# Patient Record
Sex: Female | Born: 1990 | Race: Black or African American | Hispanic: No | Marital: Married | Smoking: Never smoker
Health system: Southern US, Community
[De-identification: ages and names within clinical notes are randomized; demographics above are authoritative.]

## PROBLEM LIST (undated history)

## (undated) DIAGNOSIS — L97909 Non-pressure chronic ulcer of unspecified part of unspecified lower leg with unspecified severity: Secondary | ICD-10-CM

## (undated) DIAGNOSIS — E119 Type 2 diabetes mellitus without complications: Secondary | ICD-10-CM

## (undated) DIAGNOSIS — Z9889 Other specified postprocedural states: Secondary | ICD-10-CM

## (undated) DIAGNOSIS — I890 Lymphedema, not elsewhere classified: Secondary | ICD-10-CM

## (undated) DIAGNOSIS — I83009 Varicose veins of unspecified lower extremity with ulcer of unspecified site: Secondary | ICD-10-CM

## (undated) DIAGNOSIS — G8929 Other chronic pain: Secondary | ICD-10-CM

---
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
## 2020-09-27 ENCOUNTER — Inpatient Hospital Stay (HOSPITAL_COMMUNITY)
Admission: EM | Admit: 2020-09-27 | Discharge: 2020-10-01 | DRG: 603 | Disposition: A | Discharge status: Home / Self Care | Payer: Medicaid Other | Attending: Internal Medicine | Admitting: Internal Medicine

## 2020-09-27 ENCOUNTER — Other Ambulatory Visit: Payer: Self-pay

## 2020-09-27 ENCOUNTER — Encounter (HOSPITAL_COMMUNITY): Payer: Self-pay

## 2020-09-27 DIAGNOSIS — Z79891 Long term (current) use of opiate analgesic: Secondary | ICD-10-CM

## 2020-09-27 DIAGNOSIS — G894 Chronic pain syndrome: Secondary | ICD-10-CM

## 2020-09-27 DIAGNOSIS — L03116 Cellulitis of left lower limb: Principal | ICD-10-CM | POA: Diagnosis present

## 2020-09-27 DIAGNOSIS — D509 Iron deficiency anemia, unspecified: Secondary | ICD-10-CM | POA: Diagnosis present

## 2020-09-27 DIAGNOSIS — L03119 Cellulitis of unspecified part of limb: Secondary | ICD-10-CM | POA: Diagnosis not present

## 2020-09-27 DIAGNOSIS — D649 Anemia, unspecified: Secondary | ICD-10-CM

## 2020-09-27 DIAGNOSIS — I878 Other specified disorders of veins: Secondary | ICD-10-CM | POA: Diagnosis present

## 2020-09-27 DIAGNOSIS — E119 Type 2 diabetes mellitus without complications: Secondary | ICD-10-CM | POA: Diagnosis present

## 2020-09-27 DIAGNOSIS — Z20822 Contact with and (suspected) exposure to covid-19: Secondary | ICD-10-CM | POA: Diagnosis present

## 2020-09-27 DIAGNOSIS — R110 Nausea: Secondary | ICD-10-CM | POA: Diagnosis present

## 2020-09-27 DIAGNOSIS — L03115 Cellulitis of right lower limb: Secondary | ICD-10-CM | POA: Diagnosis present

## 2020-09-27 DIAGNOSIS — R600 Localized edema: Secondary | ICD-10-CM

## 2020-09-27 DIAGNOSIS — L0390 Cellulitis, unspecified: Secondary | ICD-10-CM | POA: Diagnosis present

## 2020-09-27 DIAGNOSIS — Z975 Presence of (intrauterine) contraceptive device: Secondary | ICD-10-CM

## 2020-09-27 DIAGNOSIS — E66813 Obesity, class 3: Secondary | ICD-10-CM

## 2020-09-27 DIAGNOSIS — Z6841 Body Mass Index (BMI) 40.0 and over, adult: Secondary | ICD-10-CM

## 2020-09-27 DIAGNOSIS — I890 Lymphedema, not elsewhere classified: Secondary | ICD-10-CM

## 2020-09-27 DIAGNOSIS — Z79899 Other long term (current) drug therapy: Secondary | ICD-10-CM

## 2020-09-27 HISTORY — DX: Type 2 diabetes mellitus without complications: E11.9

## 2020-09-27 HISTORY — DX: Other specified postprocedural states: Z98.890

## 2020-09-27 LAB — COMPREHENSIVE METABOLIC PANEL
ALT: 13 U/L (ref 0–44)
AST: 16 U/L (ref 15–41)
Albumin: 2.7 g/dL — ABNORMAL LOW (ref 3.5–5.0)
Alkaline Phosphatase: 97 U/L (ref 38–126)
Anion gap: 7 (ref 5–15)
BUN: 13 mg/dL (ref 6–20)
CO2: 24 mmol/L (ref 22–32)
Calcium: 8.8 mg/dL — ABNORMAL LOW (ref 8.9–10.3)
Chloride: 104 mmol/L (ref 98–111)
Creatinine, Ser: 0.54 mg/dL (ref 0.44–1.00)
GFR, Estimated: >60 mL/min (ref >60)
Glucose, Bld: 97 mg/dL (ref 70–99)
Potassium: 4.2 mmol/L (ref 3.5–5.1)
Sodium: 135 mmol/L (ref 135–145)
Total Bilirubin: 0.3 mg/dL (ref 0.3–1.2)
Total Protein: 8.2 g/dL — ABNORMAL HIGH (ref 6.5–8.1)

## 2020-09-27 LAB — CBC WITH DIFFERENTIAL/PLATELET
Abs Immature Granulocytes: 0.04 10*3/uL (ref 0.00–0.07)
Basophils Absolute: 0 10*3/uL (ref 0.0–0.1)
Basophils Relative: 0 %
Eosinophils Absolute: 0.2 10*3/uL (ref 0.0–0.5)
Eosinophils Relative: 2 %
HCT: 25.6 % — ABNORMAL LOW (ref 36.0–46.0)
Hemoglobin: 7.6 g/dL — ABNORMAL LOW (ref 12.0–15.0)
Immature Granulocytes: 0 %
Lymphocytes Relative: 14 %
Lymphs Abs: 1.5 10*3/uL (ref 0.7–4.0)
MCH: 21.5 pg — ABNORMAL LOW (ref 26.0–34.0)
MCHC: 29.7 g/dL — ABNORMAL LOW (ref 30.0–36.0)
MCV: 72.3 fL — ABNORMAL LOW (ref 80.0–100.0)
Monocytes Absolute: 0.9 10*3/uL (ref 0.1–1.0)
Monocytes Relative: 9 %
Neutro Abs: 8.3 10*3/uL — ABNORMAL HIGH (ref 1.7–7.7)
Neutrophils Relative %: 75 %
Platelets: 513 10*3/uL — ABNORMAL HIGH (ref 150–400)
RBC: 3.54 MIL/uL — ABNORMAL LOW (ref 3.87–5.11)
RDW: 18.6 % — ABNORMAL HIGH (ref 11.5–15.5)
WBC: 11 10*3/uL — ABNORMAL HIGH (ref 4.0–10.5)
nRBC: 0 % (ref 0.0–0.2)

## 2020-09-27 LAB — FERRITIN: Ferritin: 23 ng/mL (ref 11–307)

## 2020-09-27 LAB — IRON AND TIBC
Iron: 12 ug/dL — ABNORMAL LOW (ref 28–170)
Saturation Ratios: 4 % — ABNORMAL LOW (ref 10.4–31.8)
TIBC: 324 ug/dL (ref 250–450)
UIBC: 312 ug/dL

## 2020-09-27 LAB — RESP PANEL BY RT-PCR (FLU A&B, COVID) ARPGX2
Influenza A by PCR: NEGATIVE
Influenza B by PCR: NEGATIVE
SARS Coronavirus 2 by RT PCR: NEGATIVE

## 2020-09-27 LAB — LACTIC ACID, PLASMA: Lactic Acid, Venous: 0.9 mmol/L (ref 0.5–1.9)

## 2020-09-27 LAB — I-STAT BETA HCG BLOOD, ED (MC, WL, AP ONLY): I-stat hCG, quantitative: <5 m[IU]/mL (ref <5)

## 2020-09-27 LAB — CBG MONITORING, ED: Glucose-Capillary: 85 mg/dL (ref 70–99)

## 2020-09-27 MED ORDER — DULOXETINE HCL 60 MG PO CPEP
90.0000 mg | ORAL_CAPSULE | Freq: Every day | ORAL | Status: DC
  Administered 2020-09-28 – 2020-10-01 (×4): 90 mg via ORAL
  Filled 2020-09-27 (×5): qty 1

## 2020-09-27 MED ORDER — VANCOMYCIN HCL IN DEXTROSE 1-5 GM/200ML-% IV SOLN
1000.0000 mg | Freq: Once | INTRAVENOUS | Status: AC
  Administered 2020-09-27: 1000 mg via INTRAVENOUS
  Filled 2020-09-27: qty 200

## 2020-09-27 MED ORDER — SODIUM CHLORIDE 0.9 % IV SOLN
2.0000 g | Freq: Once | INTRAVENOUS | Status: AC
  Administered 2020-09-27: 2 g via INTRAVENOUS
  Filled 2020-09-27: qty 2

## 2020-09-27 MED ORDER — GABAPENTIN 300 MG PO CAPS
600.0000 mg | ORAL_CAPSULE | Freq: Three times a day (TID) | ORAL | Status: DC
  Administered 2020-09-27 – 2020-10-01 (×11): 600 mg via ORAL
  Filled 2020-09-27 (×12): qty 2

## 2020-09-27 MED ORDER — VANCOMYCIN HCL 1.5 G IV SOLR
1500.0000 mg | Freq: Once | INTRAVENOUS | Status: DC
  Filled 2020-09-27: qty 1500

## 2020-09-27 MED ORDER — ALBUTEROL SULFATE (2.5 MG/3ML) 0.083% IN NEBU: 2.5000 mg | INHALATION_SOLUTION | Freq: Four times a day (QID) | RESPIRATORY_TRACT | Status: DC | PRN

## 2020-09-27 MED ORDER — VANCOMYCIN HCL 1500 MG/300ML IV SOLN
1500.0000 mg | Freq: Once | INTRAVENOUS | Status: AC
  Administered 2020-09-27: 1500 mg via INTRAVENOUS
  Filled 2020-09-27: qty 300

## 2020-09-27 MED ORDER — VANCOMYCIN HCL 1500 MG/300ML IV SOLN
1500.0000 mg | Freq: Two times a day (BID) | INTRAVENOUS | Status: DC
  Administered 2020-09-28 – 2020-09-30 (×6): 1500 mg via INTRAVENOUS
  Filled 2020-09-27 (×8): qty 300

## 2020-09-27 MED ORDER — FENTANYL CITRATE (PF) 100 MCG/2ML IJ SOLN
50.0000 ug | Freq: Once | INTRAMUSCULAR | Status: AC
  Administered 2020-09-27: 50 ug via INTRAMUSCULAR
  Filled 2020-09-27: qty 2

## 2020-09-27 MED ORDER — METHOCARBAMOL 1000 MG/10ML IJ SOLN
500.0000 mg | Freq: Four times a day (QID) | INTRAVENOUS | Status: DC | PRN
  Filled 2020-09-27: qty 5

## 2020-09-27 MED ORDER — TIZANIDINE HCL 4 MG PO TABS
4.0000 mg | ORAL_TABLET | Freq: Three times a day (TID) | ORAL | Status: DC | PRN
  Administered 2020-09-28 – 2020-09-30 (×5): 4 mg via ORAL
  Filled 2020-09-27 (×5): qty 1

## 2020-09-27 MED ORDER — SODIUM CHLORIDE 0.9 % IV SOLN: 1.0000 g | Freq: Three times a day (TID) | INTRAVENOUS | Status: DC

## 2020-09-27 MED ORDER — ENOXAPARIN SODIUM 40 MG/0.4ML ~~LOC~~ SOLN
40.0000 mg | SUBCUTANEOUS | Status: DC
  Administered 2020-09-27: 40 mg via SUBCUTANEOUS
  Filled 2020-09-27: qty 0.4

## 2020-09-27 MED ORDER — ONDANSETRON HCL 4 MG/2ML IJ SOLN: 4.0000 mg | Freq: Four times a day (QID) | INTRAMUSCULAR | Status: DC | PRN

## 2020-09-27 MED ORDER — HYDROMORPHONE HCL 1 MG/ML IJ SOLN
0.5000 mg | Freq: Once | INTRAMUSCULAR | Status: AC
  Administered 2020-09-27: 0.5 mg via INTRAVENOUS
  Filled 2020-09-27: qty 1

## 2020-09-27 MED ORDER — ACETAMINOPHEN 650 MG RE SUPP: 650.0000 mg | Freq: Four times a day (QID) | RECTAL | Status: DC | PRN

## 2020-09-27 MED ORDER — MORPHINE SULFATE (PF) 2 MG/ML IV SOLN
2.0000 mg | INTRAVENOUS | Status: DC | PRN
  Administered 2020-09-27 – 2020-09-29 (×6): 2 mg via INTRAVENOUS
  Filled 2020-09-27 (×6): qty 1

## 2020-09-27 MED ORDER — DIPHENHYDRAMINE HCL 25 MG PO CAPS
25.0000 mg | ORAL_CAPSULE | Freq: Once | ORAL | Status: AC
  Administered 2020-09-27: 25 mg via ORAL
  Filled 2020-09-27: qty 1

## 2020-09-27 MED ORDER — OXYCODONE HCL 5 MG PO TABS
10.0000 mg | ORAL_TABLET | ORAL | Status: DC | PRN
  Administered 2020-09-28 – 2020-10-01 (×13): 10 mg via ORAL
  Filled 2020-09-27 (×16): qty 2

## 2020-09-27 MED ORDER — SODIUM CHLORIDE 0.9 % IV SOLN
2.0000 g | Freq: Three times a day (TID) | INTRAVENOUS | Status: DC
  Administered 2020-09-28 – 2020-10-01 (×10): 2 g via INTRAVENOUS
  Filled 2020-09-27 (×13): qty 2

## 2020-09-27 MED ORDER — HYDROMORPHONE HCL 1 MG/ML IJ SOLN
0.5000 mg | Freq: Once | INTRAMUSCULAR | Status: AC
Start: 2020-09-27 — End: 2020-09-27
  Administered 2020-09-27: 0.5 mg via INTRAVENOUS
  Filled 2020-09-27: qty 1

## 2020-09-27 MED ORDER — ONDANSETRON HCL 4 MG PO TABS: 4.0000 mg | ORAL_TABLET | Freq: Four times a day (QID) | ORAL | Status: DC | PRN

## 2020-09-27 MED ORDER — ACETAMINOPHEN 325 MG PO TABS
650.0000 mg | ORAL_TABLET | Freq: Four times a day (QID) | ORAL | Status: DC | PRN
  Administered 2020-09-30: 650 mg via ORAL
  Filled 2020-09-27: qty 2

## 2020-09-27 NOTE — H&P (Signed)
History and Physical  Patient Name: [name] [name]     FXT:[id number]    DOB: 11/10/1990    DOA: 09/27/2020 PCP: Pcp, No  Patient coming from: Home  Chief Complaint: Bilateral lower extremity wounds, pain    HPI: [name] [name] is a 30 y.o. female, with PMH of chronic lymphedema, chronic bilateral lower extremity wounds, chronic pain who presented to the ER on 09/27/2020 with worsening bilateral lower extremity pain.  Patient presents due to bilateral lower extremity pain that is worsening around her chronic wounds.  She said that she been more erythema, edema, with some small amount of drainage.  She says she also had a fever at home.  She has not been on antibiotics in over a month.  She says she follows outpatient with wound care once a month.  She has had multiple ER visits for similar presentations.  She was last evaluated at [facility] ER on 09/27/2020.  She has been taking her home oral pain medications with no improvement.  She also complains of ongoing chronic nausea.  She has had associated difficulty with ambulation due to pain.    ED course: -Vitals on admission: Afebrile, heart rate 112, blood pressure 132/67, maintaining sats on room air -Labs on initial presentation: Sodium 135, potassium 4.2, chloride 104, bicarb 24, glucose 97, BUN 13, creatinine 0.5, calcium 8.8, WBC 11, hemoglobin 7.6, platelets 513 -Imaging obtained on admission: none -In the ED the patient was given vancomycin, cefepime, Benadryl, Dilaudid, fentanyl, and the hospitalist service was contacted for further evaluation and management.     ROS: A complete and thorough 12 point review of systems obtained, negative listed in HPI.     Past Medical History:  Diagnosis Date  . DM (diabetes mellitus) (HCC)   . S/P debridement     History reviewed. No pertinent surgical history.  Social History: Patient lives at home.  The patient walks without assistance.  Non smoker.  Allergies  Allergen Reactions  .  Silver     Family history: family history is not on file.  Prior to Admission medications   Medication Sig Start Date End Date Taking? Authorizing Provider  DULoxetine (CYMBALTA) 30 MG capsule Take 90 mg by mouth daily. 08/11/20  Yes [provider]  gabapentin (NEURONTIN) 600 MG tablet Take 600 mg by mouth 3 (three) times daily. 09/15/20  Yes [provider]  HYDROcodone-acetaminophen (NORCO) 10-325 MG tablet Take 1 tablet by mouth in the morning, at noon, in the evening, and at bedtime. 09/17/20  Yes [provider]  tiZANidine (ZANAFLEX) 4 MG tablet Take 4 mg by mouth 3 (three) times daily as needed for muscle spasms. 09/05/20  Yes [provider]       Physical Exam: BP (!) 142/79   Pulse (!) 116   Temp 97.7 F (36.5 C) (Rectal)   Resp 17   Ht 5\' 7"  (1.702 m)   Wt 131.1 kg   SpO2 100%   BMI 45.26 kg/m   General appearance: Well-developed, adult female, alert and in moderate distress secondary to pain Eyes: Anicteric, conjunctiva pink, lids and lashes normal. PERRL.    ENT: No nasal deformity, discharge, epistaxis.  Hearing intact. OP moist without lesions.   Neck: No neck masses.  Trachea midline.  No thyromegaly/tenderness. Lymph: No cervical or supraclavicular lymphadenopathy. Cardiac: RRR, nl S1-S2, no murmurs appreciated.    Bilateral lower extremity lymphedema Respiratory: Normal respiratory rate and rhythm.  CTAB without rales or wheezes. Abdomen: Abdomen soft.  No  tenderness with palpation. No ascites, distension, hepatosplenomegaly.   MSK:  Bilateral lower extremity ulcer/wound primarily on the posterior lower legs.  Some erythema.  Tenderness and warmth noted Neuro: Cranial nerves 2 through 12 grossly intact.  Sensation intact to light touch. Speech is fluent.  Marland Kitchen    Psych: Sensorium intact and responding to questions, attention normal.  Behavior appropriate.  Judgment and insight appear normal.    Labs on Admission:  I have  personally reviewed following labs and imaging studies: CBC: Recent Labs  Lab 09/27/20 1940  WBC 11.0*  NEUTROABS 8.3*  HGB 7.6*  HCT 25.6*  MCV 72.3*  PLT 513*   Basic Metabolic Panel: Recent Labs  Lab 09/27/20 1940  NA 135  K 4.2  CL 104  CO2 24  GLUCOSE 97  BUN 13  CREATININE 0.54  CALCIUM 8.8*   GFR: Estimated Creatinine Clearance: 142.5 mL/min (by C-G formula based on SCr of 0.54 mg/dL).  Liver Function Tests: Recent Labs  Lab 09/27/20 1940  AST 16  ALT 13  ALKPHOS 97  BILITOT 0.3  PROT 8.2*  ALBUMIN 2.7*   No results for input(s): LIPASE, AMYLASE in the last 168 hours. No results for input(s): AMMONIA in the last 168 hours. Coagulation Profile: No results for input(s): INR, PROTIME in the last 168 hours. Cardiac Enzymes: No results for input(s): CKTOTAL, CKMB, CKMBINDEX, TROPONINI in the last 168 hours. BNP (last 3 results) No results for input(s): PROBNP in the last 8760 hours. HbA1C: No results for input(s): HGBA1C in the last 72 hours. CBG: Recent Labs  Lab 09/27/20 1826  GLUCAP 85   Lipid Profile: No results for input(s): CHOL, HDL, LDLCALC, TRIG, CHOLHDL, LDLDIRECT in the last 72 hours. Thyroid Function Tests: No results for input(s): TSH, T4TOTAL, FREET4, T3FREE, THYROIDAB in the last 72 hours. Anemia Panel: No results for input(s): VITAMINB12, FOLATE, FERRITIN, TIBC, IRON, RETICCTPCT in the last 72 hours.   No results found for this or any previous visit (from the past 240 hour(s)).         Radiological Exams on Admission: Personally reviewed imaging which shows: No results found.        Assessment/Plan   1.  Acute on chronic bilateral lower extremity wounds/ulcer -Patient presents with worsening pain and erythema around her chronic lower extremity wounds - History of lymphedema - Continue IV antibiotics with cefepime and vancomycin - Pain control as warranted - Wound care consulted  2.  Chronic pain syndrome -  Patient appears to have chronic pain related to her wounds.  At home on hydrocodone, gabapentin, muscle relaxer - Continue home gabapentin and muscle relaxer - Continue pain control as warranted  3.  Anemia -On admission hemoglobin 7.6 - Appears to have chronic anemia based on outside labs - Iron studies ordered - No transfusion warranted at this time - Follow-up labs ordered  4.  Chronic bilateral lower extremity edema, lymphedema - Suspect source of bilateral lower extremity wounds - See further plans above  5.  Morbid obesity -BMI 45 - Recommend diet, exercise and weight loss     DVT prophylaxis: Lovenox Code Status: Full Family Communication: Patient herself Disposition Plan: Anticipate discharge home when medically optimized Consults called: None Admission status: Observation   At the point of initial evaluation, it is my clinical opinion that admission for OBSERVATION is reasonable and necessary because the patient's presenting complaints in the context of their chronic conditions represent sufficient risk of deterioration or significant morbidity to constitute reasonable grounds  for close observation in the hospital setting, but that the patient may be medically stable for discharge from the hospital within 24 to 48 hours.    Medical decision making: Patient seen at 10:34 PM on 09/27/2020.  The patient was discussed with ER provider.  What exists of the patient's chart was reviewed in depth and summarized above.  Clinical condition: Fair.        Laqueta Due [facility] Please page though AMION or Epic secure chat:  For password, contact charge nurse

## 2020-09-27 NOTE — ED Notes (Signed)
ED TO INPATIENT HANDOFF REPORT  Name/Age/Gender [name] [name] 30 y.o. female  Code Status    Code Status Orders  (From admission, onward)         Start     Ordered   09/27/20 2149  Full code  Continuous        09/27/20 2150        Code Status History    This patient has a current code status but no historical code status.   Advance Care Planning Activity      Home/SNF/Other Home  Chief Complaint Cellulitis [L03.90]  Level of Care/Admitting Diagnosis ED Disposition    ED Disposition Condition Comment   Admit  [facility]ospital [facility]: [facility] [[id number]]  Level of Care: Med-Surg [16]  Covid Evaluation: Asymptomatic Screening Protocol (No Symptoms)  Diagnosis: Cellulitis [474259]  Admitting Physician: Laqueta Due [[id number]]  Attending Physician: Laqueta Due [[id number]]       Medical History Past Medical History:  Diagnosis Date  . DM (diabetes mellitus) (HCC)   . S/P debridement     Allergies Allergies  Allergen Reactions  . Silver     IV Location/Drains/Wounds Patient Lines/Drains/Airways Status    Active Line/Drains/Airways    Name Placement date Placement time Site Days   Peripheral IV 09/27/20 Left Forearm 09/27/20  1929  Forearm  less than 1   Peripheral IV 09/27/20 Left Forearm 09/27/20  1940  Forearm  less than 1          Labs/Imaging Results for orders placed or performed during the hospital encounter of 09/27/20 (from the past 48 hour(s))  CBG monitoring, ED     Status: None   Collection Time: 09/27/20  6:26 PM  Result Value Ref Range   Glucose-Capillary 85 70 - 99 mg/dL    Comment: Glucose reference range applies only to samples taken after fasting for at least 8 hours.  Comprehensive metabolic panel     Status: Abnormal   Collection Time: 09/27/20  7:40 PM  Result Value Ref Range   Sodium 135 135 - 145 mmol/L   Potassium 4.2 3.5 - 5.1 mmol/L   Chloride 104 98 - 111 mmol/L   CO2 24 22 - 32 mmol/L    Glucose, Bld 97 70 - 99 mg/dL    Comment: Glucose reference range applies only to samples taken after fasting for at least 8 hours.   BUN 13 6 - 20 mg/dL   Creatinine, Ser 8.84 0.44 - 1.00 mg/dL   Calcium 8.8 (L) 8.9 - 10.3 mg/dL   Total Protein 8.2 (H) 6.5 - 8.1 g/dL   Albumin 2.7 (L) 3.5 - 5.0 g/dL   AST 16 15 - 41 U/L   ALT 13 0 - 44 U/L   Alkaline Phosphatase 97 38 - 126 U/L   Total Bilirubin 0.3 0.3 - 1.2 mg/dL   GFR, Estimated >16 >60 mL/min    Comment: (NOTE) Calculated using the CKD-EPI Creatinine Equation (2021)    Anion gap 7 5 - 15    Comment: Performed at [facility], [address], [place], [place] [address]  CBC with Differential     Status: Abnormal   Collection Time: 09/27/20  7:40 PM  Result Value Ref Range   WBC 11.0 (H) 4.0 - 10.5 K/uL   RBC 3.54 (L) 3.87 - 5.11 MIL/uL   Hemoglobin 7.6 (L) 12.0 - 15.0 g/dL    Comment: Reticulocyte Hemoglobin testing may be clinically indicated, consider ordering this additional  test [id number]    HCT 25.6 (L) 36.0 - 46.0 %   MCV 72.3 (L) 80.0 - 100.0 fL   MCH 21.5 (L) 26.0 - 34.0 pg   MCHC 29.7 (L) 30.0 - 36.0 g/dL   RDW 54.018.6 (H) 98.111.5 - 19.115.5 %   Platelets 513 (H) 150 - 400 K/uL   nRBC 0.0 0.0 - 0.2 %   Neutrophils Relative % 75 %   Neutro Abs 8.3 (H) 1.7 - 7.7 K/uL   Lymphocytes Relative 14 %   Lymphs Abs 1.5 0.7 - 4.0 K/uL   Monocytes Relative 9 %   Monocytes Absolute 0.9 0.1 - 1.0 K/uL   Eosinophils Relative 2 %   Eosinophils Absolute 0.2 0.0 - 0.5 K/uL   Basophils Relative 0 %   Basophils Absolute 0.0 0.0 - 0.1 K/uL   Immature Granulocytes 0 %   Abs Immature Granulocytes 0.04 0.00 - 0.07 K/uL    Comment: Performed at [facility], [address] [place], [place] [address]  Lactic acid, plasma     Status: None   Collection Time: 09/27/20  7:40 PM  Result Value Ref Range   Lactic Acid, Venous 0.9 0.5 - 1.9 mmol/L    Comment: Performed at [facility], [address] [place], [place] [address]  I-Stat Beta hCG blood, ED ([facility], [facility], [facility] only)     Status: None   Collection Time: 09/27/20  8:38 PM  Result Value Ref Range   I-stat hCG, quantitative <5.0 <5 mIU/mL   Comment 3            Comment:   GEST. AGE      CONC.  (mIU/mL)   <=1 WEEK        5 - 50     2 WEEKS       50 - 500     3 WEEKS       100 - 10,000     4 WEEKS     1,000 - 30,000        FEMALE AND NON-PREGNANT FEMALE:     LESS THAN 5 mIU/mL   Resp Panel by RT-PCR (Flu A&B, Covid) Nasopharyngeal Swab     Status: None   Collection Time: 09/27/20  9:22 PM   Specimen: Nasopharyngeal Swab; Nasopharyngeal(NP) swabs in vial transport medium  Result Value Ref Range   SARS Coronavirus 2 by RT PCR NEGATIVE NEGATIVE    Comment: (NOTE) SARS-CoV-2 target nucleic acids are NOT DETECTED.  The SARS-CoV-2 RNA is generally detectable in upper respiratory specimens during the acute phase of infection. The lowest concentration of SARS-CoV-2 viral copies this assay can detect is 138 copies/mL. A negative result does not preclude SARS-Cov-2 infection and should not be used as the sole basis for treatment or other patient management decisions. A negative result may occur with  improper specimen collection/handling, submission of specimen other than nasopharyngeal swab, presence of viral mutation(s) within the areas targeted by this assay, and inadequate number of viral copies(<138 copies/mL). A negative result must be combined with clinical observations, patient history, and epidemiological information. The expected result is Negative.  Fact Sheet for Patients:  BloggerCourse.comhttps://www.fda.gov/media/152166/download  Fact Sheet for Healthcare Providers:  SeriousBroker.ithttps://www.fda.gov/media/152162/download  This test is no t yet approved or cleared by the [place] FDA and  has been authorized for detection and/or diagnosis of SARS-CoV-2 by FDA under an Emergency Use Authorization (EUA). This EUA will remain  in  effect (meaning this test can  be used) for the duration of the COVID-19 declaration under Section 564(b)(1) of the Act, 21 U.S.C.section 360bbb-3(b)(1), unless the authorization is terminated  or revoked sooner.       Influenza A by PCR NEGATIVE NEGATIVE   Influenza B by PCR NEGATIVE NEGATIVE    Comment: (NOTE) The Xpert Xpress SARS-CoV-2/FLU/RSV plus assay is intended as an aid in the diagnosis of influenza from Nasopharyngeal swab specimens and should not be used as a sole basis for treatment. Nasal washings and aspirates are unacceptable for Xpert Xpress SARS-CoV-2/FLU/RSV testing.  Fact Sheet for Patients: BloggerCourse.com  Fact Sheet for Healthcare Providers: SeriousBroker.it  This test is not yet approved or cleared by the [place] FDA and has been authorized for detection and/or diagnosis of SARS-CoV-2 by FDA under an Emergency Use Authorization (EUA). This EUA will remain in effect (meaning this test can be used) for the duration of the COVID-19 declaration under Section 564(b)(1) of the Act, 21 U.S.C. section 360bbb-3(b)(1), unless the authorization is terminated or revoked.  Performed at [facility], [address] [place], [place] [address]    No results found.  Pending Labs Unresulted Labs (From admission, onward)          Start     Ordered   10/04/20 0500  Creatinine, serum  (enoxaparin (LOVENOX)    CrCl >/= 30 ml/min)  Weekly,   R     Comments: while on enoxaparin therapy    09/27/20 2150   09/28/20 0500  HIV Antibody (routine testing w rflx)  (HIV Antibody (Routine testing w reflex) panel)  Tomorrow morning,   R        09/27/20 2150   09/28/20 0500  Comprehensive metabolic panel  Tomorrow morning,   R        09/27/20 2150   09/28/20 0500  CBC  Tomorrow morning,   R        09/27/20 2150   09/28/20 0500  Magnesium  Tomorrow morning,   R        09/27/20 2150   09/28/20 0500   Phosphorus  Tomorrow morning,   R        09/27/20 2150   09/27/20 2238  Ferritin  Add-on,   AD        09/27/20 2237   09/27/20 2238  Iron and TIBC  Add-on,   AD        09/27/20 2237   09/27/20 1829  Blood culture (routine x 2)  BLOOD CULTURE X 2,   STAT      09/27/20 1829          Vitals/Pain Today's Vitals   09/27/20 2200 09/27/20 2214 09/27/20 2222 09/27/20 2230  BP: (!) 142/79   (!) 124/59  Pulse: (!) 116   (!) 108  Resp: 17   20  Temp:      TempSrc:      SpO2: 100%   99%  Weight:  131.1 kg    Height:  5\' 7"  (1.702 m)    PainSc:   10-Worst pain ever     Isolation Precautions Airborne and Contact precautions  Medications Medications  enoxaparin (LOVENOX) injection 40 mg (has no administration in time range)  acetaminophen (TYLENOL) tablet 650 mg (has no administration in time range)    Or  acetaminophen (TYLENOL) suppository 650 mg (has no administration in time range)  oxyCODONE (Oxy IR/ROXICODONE) immediate release tablet 10 mg (has no administration in time range)  morphine 2 MG/ML injection  2 mg (has no administration in time range)  ondansetron (ZOFRAN) tablet 4 mg (has no administration in time range)    Or  ondansetron (ZOFRAN) injection 4 mg (has no administration in time range)  albuterol (PROVENTIL) (2.5 MG/3ML) 0.083% nebulizer solution 2.5 mg (has no administration in time range)  DULoxetine (CYMBALTA) DR capsule 90 mg (has no administration in time range)  gabapentin (NEURONTIN) capsule 600 mg (has no administration in time range)  tiZANidine (ZANAFLEX) tablet 4 mg (has no administration in time range)  ceFEPIme (MAXIPIME) 2 g in sodium chloride 0.9 % 100 mL IVPB (has no administration in time range)  vancomycin (VANCOREADY) IVPB 1500 mg/300 mL (has no administration in time range)  fentaNYL (SUBLIMAZE) injection 50 mcg (50 mcg Intramuscular Given 09/27/20 1859)  HYDROmorphone (DILAUDID) injection 0.5 mg (0.5 mg Intravenous Given 09/27/20 2030)   diphenhydrAMINE (BENADRYL) capsule 25 mg (25 mg Oral Given 09/27/20 2030)  vancomycin (VANCOCIN) IVPB 1000 mg/200 mL premix (0 mg Intravenous Stopped 09/27/20 2244)  ceFEPIme (MAXIPIME) 2 g in sodium chloride 0.9 % 100 mL IVPB (0 g Intravenous Stopped 09/27/20 2151)  HYDROmorphone (DILAUDID) injection 0.5 mg (0.5 mg Intravenous Given 09/27/20 2137)    Mobility Usually ambulatory with walker, due to pain patient has used a wheelchair while in ED

## 2020-09-27 NOTE — ED Provider Notes (Signed)
[facility]-EMERGENCY DEPT Provider Note   CSN: [id number] Arrival date & time: 09/27/20  1746     History Chief Complaint  Patient presents with  . Leg Swelling  . Wound Infection    [name] [name] is a 30 y.o. female.  HPI   30 year old female history of diabetes, who presents to the emergency department today for evaluation of bilateral lower extremity pain and wound infection.  She has chronic bilateral lower extremity wounds that are nonhealing that she states for the last 2 days have been more painful, red and swollen.  She states that she has had some drainage from the wounds and is also had a fever at home.  She has not been on any antibiotics recently per her report.  Past Medical History:  Diagnosis Date  . DM (diabetes mellitus) (HCC)   . S/P debridement     Patient Active Problem List   Diagnosis Date Noted  . Cellulitis 09/27/2020    History reviewed. No pertinent surgical history.   OB History   No obstetric history on file.     No family history on file.     Home Medications Prior to Admission medications   Medication Sig Start Date End Date Taking? Authorizing Provider  DULoxetine (CYMBALTA) 30 MG capsule Take 90 mg by mouth daily. 08/11/20  Yes [provider]  gabapentin (NEURONTIN) 600 MG tablet Take 600 mg by mouth 3 (three) times daily. 09/15/20  Yes [provider]  HYDROcodone-acetaminophen (NORCO) 10-325 MG tablet Take 1 tablet by mouth in the morning, at noon, in the evening, and at bedtime. 09/17/20  Yes [provider]  tiZANidine (ZANAFLEX) 4 MG tablet Take 4 mg by mouth 3 (three) times daily as needed for muscle spasms. 09/05/20  Yes [provider]    Allergies    Silver  Review of Systems   Review of Systems  Constitutional: Negative for fever.  HENT: Negative for ear pain and sore throat.   Eyes: Negative for visual disturbance.  Respiratory: Negative for cough and shortness  of breath.   Cardiovascular: Positive for leg swelling. Negative for chest pain.  Gastrointestinal: Negative for abdominal pain, constipation, diarrhea, nausea and vomiting.  Genitourinary: Negative for dysuria and hematuria.  Musculoskeletal:       Ble pain  Skin: Positive for wound.  Neurological: Negative for headaches.  All other systems reviewed and are negative.   Physical Exam Updated Vital Signs BP (!) 147/82   Pulse (!) 117   Temp 97.7 F (36.5 C) (Rectal)   Resp (!) 22   SpO2 100%   Physical Exam Vitals and nursing note reviewed.  Constitutional:      General: She is in acute distress.     Appearance: She is well-developed.  HENT:     Head: Normocephalic and atraumatic.  Eyes:     Conjunctiva/sclera: Conjunctivae normal.  Cardiovascular:     Rate and Rhythm: Regular rhythm. Tachycardia present.     Heart sounds: No murmur heard.   Pulmonary:     Effort: Pulmonary effort is normal. No respiratory distress.     Breath sounds: Normal breath sounds.  Abdominal:     Palpations: Abdomen is soft.     Tenderness: There is no abdominal tenderness.  Musculoskeletal:     Cervical back: Neck supple.     Comments: Chronic wounds to the bilateral lower extremities as noted below.  There is surrounding induration and erythema to both of the wounds and significant  tenderness noted.  There is some purulent drainage noted to the wounds as well.  There is no crepitus.  Skin:    General: Skin is warm and dry.  Neurological:     Mental Status: She is alert.       ED Results / Procedures / Treatments   Labs (all labs ordered are listed, but only abnormal results are displayed) Labs Reviewed  COMPREHENSIVE METABOLIC PANEL - Abnormal; Notable for the following components:      Result Value   Calcium 8.8 (*)    Total Protein 8.2 (*)    Albumin 2.7 (*)    All other components within normal limits  CBC WITH DIFFERENTIAL/PLATELET - Abnormal; Notable for the following  components:   WBC 11.0 (*)    RBC 3.54 (*)    Hemoglobin 7.6 (*)    HCT 25.6 (*)    MCV 72.3 (*)    MCH 21.5 (*)    MCHC 29.7 (*)    RDW 18.6 (*)    Platelets 513 (*)    Neutro Abs 8.3 (*)    All other components within normal limits  CULTURE, BLOOD (ROUTINE X 2)  CULTURE, BLOOD (ROUTINE X 2)  RESP PANEL BY RT-PCR (FLU A&B, COVID) [id number]  LACTIC ACID, PLASMA  CBG MONITORING, ED  I-STAT BETA HCG BLOOD, ED ([facility], [facility], [facility] ONLY)    EKG None  Radiology No results found.  Procedures Procedures   Medications Ordered in ED Medications  vancomycin (VANCOCIN) IVPB 1000 mg/200 mL premix (1,000 mg Intravenous New Bag/Given 09/27/20 2121)  ceFEPIme (MAXIPIME) 2 g in sodium chloride 0.9 % 100 mL IVPB (0 g Intravenous Stopped 09/27/20 2151)  fentaNYL (SUBLIMAZE) injection 50 mcg (50 mcg Intramuscular Given 09/27/20 1859)  HYDROmorphone (DILAUDID) injection 0.5 mg (0.5 mg Intravenous Given 09/27/20 2030)  diphenhydrAMINE (BENADRYL) capsule 25 mg (25 mg Oral Given 09/27/20 2030)  HYDROmorphone (DILAUDID) injection 0.5 mg (0.5 mg Intravenous Given 09/27/20 2137)    ED Course  I have reviewed the triage vital signs and the nursing notes.  Pertinent labs & imaging results that were available during my care of the patient were reviewed by me and considered in my medical decision making (see chart for details).  Clinical Course as of 09/27/20 2143  Sun Sep 27, 2020  [id number] 30 year old diabetic here by ambulance for bilateral lower extremity pain.  She has chronic lymphedema and deep ulcerations on her legs.  They do not look overtly infected.  She was tachycardic on arrival with this is improving.  Getting some pain medication and checking some labs.  Likely can be discharged if no significant derangements. [MB]    Clinical Course User Index [MB] Terrilee Files, MD   MDM Rules/Calculators/A&P                          30 year old female with chronic bilateral lower extremity wounds coming in  today with worsening of her pain and increased redness/swelling.  She states she is had fevers at home  CBC shows a mild leukocytosis at 11, hemoglobin is 7 which on chart review appears chronic CMP is grossly reassuring Lactic acid is negative Blood cultures were obtained Pregnancy test negative Covid pending  ekg pending on admission   Patient with chronic bilateral lower extremity wounds which appear infected today.  Patient was started on broad-spectrum antibiotics will be admitted to the hospital for further treatment  9:25 PM CONSULT with Dr. Marikay Alar who  accepts patient for admission   Final Clinical Impression(s) / ED Diagnoses Final diagnoses:  Cellulitis, unspecified cellulitis site    Rx / DC Orders ED Discharge Orders    None       Rayne Du 09/27/20 2143    Terrilee Files, MD 09/28/20 1003

## 2020-09-27 NOTE — ED Notes (Signed)
Patient again informed not to scratch wounds

## 2020-09-27 NOTE — ED Notes (Signed)
Pt assisted to restroom via wheelchair

## 2020-09-27 NOTE — Progress Notes (Signed)
PHARMACY NOTE:  ANTIMICROBIAL RENAL DOSAGE ADJUSTMENT  Current antimicrobial regimen includes a mismatch between antimicrobial dosage and estimated renal function.  As per policy approved by the Pharmacy & Therapeutics and Medical Executive Committees, the antimicrobial dosage will be adjusted accordingly.  Current antimicrobial dosage:  Cefepime 1gm IV [phone]  Indication: Wound infection  Renal Function:  Estimated Creatinine Clearance: 142.5 mL/min (by C-G formula based on SCr of 0.54 mg/dL). Weight = 131.1 kg  Antimicrobial dosage has been changed to:  Cefepime 2gm IV q8h   Thank you for allowing pharmacy to be a part of this patient's care.  Maryellen Pile, [facility] 09/27/2020 10:30 PM

## 2020-09-27 NOTE — Progress Notes (Signed)
Pharmacy Antibiotic Note  [name] [name] is a 30 y.o. female admitted on 09/27/2020 with cellulitis.  PMH significant for DM, chronic lower extremity wounds.  Patient received Vancomycin 1gm and Cefepime 2gm IV x 1 dose each in the ED.  Upon admission, Pharmacy has been consulted for Vancomycin dosing.  Plan: Give an additional Vancomycin 1500mg  IV x 1 dose now for total loading dose of 2500mg  (20 mg/kg) then continue with Vancomycin 1500 mg IV Q 12 hrs. Goal AUC 400-550.  Expected AUC: 532.9  SCr used: 0.8 (rounded up from 0.54) Cefepime 2gm IV q8h  Follow renal function  F/U culture results and sensivities  Monitor vancomycin levels as needed  Height: 5\' 7"  (170.2 cm) Weight: 131.1 kg (289 lb) IBW/kg (Calculated) : 61.6  Temp (24hrs), Avg:98.1 F (36.7 C), Min:97.7 F (36.5 C), Max:98.5 F (36.9 C)  Recent Labs  Lab 09/27/20 1940  WBC 11.0*  CREATININE 0.54  LATICACIDVEN 0.9    Estimated Creatinine Clearance: 142.5 mL/min (by C-G formula based on SCr of 0.54 mg/dL).    Allergies  Allergen Reactions  . Silver     Antimicrobials this admission: 4/17 Cefepime >>   4/17 Vancomycin >>    Dose adjustments this admission:    Microbiology results: 4/17 BCx:    Thank you for allowing pharmacy to be a part of this patient's care.  5/17, PharmD 09/27/2020 10:41 PM

## 2020-09-27 NOTE — ED Triage Notes (Signed)
Coming from home, was at [facility] 2 weeks ago for same, per ems, increased foot swelling since then, bilateral calf ulcers, is being seen by wound care at home

## 2020-09-28 DIAGNOSIS — I890 Lymphedema, not elsewhere classified: Secondary | ICD-10-CM

## 2020-09-28 DIAGNOSIS — R110 Nausea: Secondary | ICD-10-CM | POA: Diagnosis present

## 2020-09-28 DIAGNOSIS — L0390 Cellulitis, unspecified: Secondary | ICD-10-CM | POA: Diagnosis present

## 2020-09-28 DIAGNOSIS — I878 Other specified disorders of veins: Secondary | ICD-10-CM | POA: Diagnosis present

## 2020-09-28 DIAGNOSIS — L03116 Cellulitis of left lower limb: Secondary | ICD-10-CM | POA: Diagnosis present

## 2020-09-28 DIAGNOSIS — Z79891 Long term (current) use of opiate analgesic: Secondary | ICD-10-CM | POA: Diagnosis not present

## 2020-09-28 DIAGNOSIS — Z975 Presence of (intrauterine) contraceptive device: Secondary | ICD-10-CM | POA: Diagnosis not present

## 2020-09-28 DIAGNOSIS — E119 Type 2 diabetes mellitus without complications: Secondary | ICD-10-CM | POA: Diagnosis present

## 2020-09-28 DIAGNOSIS — Z20822 Contact with and (suspected) exposure to covid-19: Secondary | ICD-10-CM | POA: Diagnosis present

## 2020-09-28 DIAGNOSIS — Z6841 Body Mass Index (BMI) 40.0 and over, adult: Secondary | ICD-10-CM | POA: Diagnosis not present

## 2020-09-28 DIAGNOSIS — Z79899 Other long term (current) drug therapy: Secondary | ICD-10-CM | POA: Diagnosis not present

## 2020-09-28 DIAGNOSIS — G894 Chronic pain syndrome: Secondary | ICD-10-CM | POA: Diagnosis present

## 2020-09-28 DIAGNOSIS — L03115 Cellulitis of right lower limb: Secondary | ICD-10-CM | POA: Diagnosis present

## 2020-09-28 DIAGNOSIS — R600 Localized edema: Secondary | ICD-10-CM | POA: Diagnosis not present

## 2020-09-28 DIAGNOSIS — D509 Iron deficiency anemia, unspecified: Secondary | ICD-10-CM | POA: Diagnosis present

## 2020-09-28 MED ORDER — FERROUS GLUCONATE 324 (38 FE) MG PO TABS
324.0000 mg | ORAL_TABLET | Freq: Every day | ORAL | Status: DC
  Administered 2020-09-29 – 2020-10-01 (×3): 324 mg via ORAL
  Filled 2020-09-28 (×4): qty 1

## 2020-09-28 MED ORDER — ENOXAPARIN SODIUM 60 MG/0.6ML ~~LOC~~ SOLN
60.0000 mg | SUBCUTANEOUS | Status: DC
  Administered 2020-09-28 – 2020-09-30 (×3): 60 mg via SUBCUTANEOUS
  Filled 2020-09-28 (×3): qty 0.6

## 2020-09-28 NOTE — Progress Notes (Signed)
[facility]  PROGRESS NOTE  [name] [name] ZOX:[id number] DOB: 12/28/1990 DOA: 09/27/2020 PCP: Pcp, No   Brief HPI:   30 year old female with past medical history of chronic lymphedema, chronic bilateral lower extremity wounds, chronic pain syndrome came to the ED with complaints of worsening bilateral lower extremity pain.  Patient has chronic wounds on lower extremity and has been having pain, she noted more erythema, edema and small amount of drainage. Patient was started on IV antibiotics, vancomycin and cefepime   Subjective   Patient seen and examined, denies any complaints   Assessment/Plan:     1. Acute on chronic bilateral lower extremity wound/ulcer-patient presented with worsening of the erythema and pain in the chronic lower extremity wounds.  She has history of lymphedema.  Patient empirically started on vancomycin and cefepime.  Appreciate wound care recommendation, recommended to cover the wound with silver Hydrofiber. 2. Iron deficiency anemia-patient's hemoglobin on presentation was 7.6, serum iron 12, TIBC 324, saturation 4, ferritin 23.  We will start p.o. iron, she will need IV iron once her infection of lower extremity wounds is controlled.  Unclear etiology, will check FOBT.?  Menorrhagia. 3. Chronic bilateral lower extremity edema/lymphedema-stable 4. Morbid obesity-BMI 45 kg/m 5. Chronic pain syndrome-continue home medication including hydrocodone, gabapentin, duloxetine  Scheduled medications:   . DULoxetine  90 mg Oral Daily  . enoxaparin (LOVENOX) injection  60 mg Subcutaneous Q24H  . gabapentin  600 mg Oral TID         Data Reviewed:   CBG:  Recent Labs  Lab 09/27/20 1826  GLUCAP 85    SpO2: 100 %    Vitals:   09/27/20 2300 09/28/20 0055 09/28/20 0614 09/28/20 1345  BP: (!) 138/96 135/89 125/61 (!) 108/53  Pulse: 96 (!) 103 86 83  Resp: 19 18 18 17   Temp: 98.9 F (37.2 C) 98.9 F (37.2 C) 98.3 F (36.8 C) 98.6 F (37 C)   TempSrc: Oral     SpO2: 100% 100% 100% 100%  Weight:      Height:         Intake/Output Summary (Last 24 hours) at 09/28/2020 1654 Last data filed at 09/28/2020 1410 Gross per 24 hour  Intake 880 ml  Output --  Net 880 ml    04/16 1901 - 04/18 0700 In: 300  Out: -   Filed Weights   09/27/20 2214  Weight: 131.1 kg    CBC:  Recent Labs  Lab 09/27/20 1940  WBC 11.0*  HGB 7.6*  HCT 25.6*  PLT 513*  MCV 72.3*  MCH 21.5*  MCHC 29.7*  RDW 18.6*  LYMPHSABS 1.5  MONOABS 0.9  EOSABS 0.2  BASOSABS 0.0    Complete metabolic panel:  Recent Labs  Lab 09/27/20 1940  NA 135  K 4.2  CL 104  CO2 24  GLUCOSE 97  BUN 13  CREATININE 0.54  CALCIUM 8.8*  AST 16  ALT 13  ALKPHOS 97  BILITOT 0.3  ALBUMIN 2.7*  LATICACIDVEN 0.9    No results for input(s): LIPASE, AMYLASE in the last 168 hours.  Recent Labs  Lab 09/27/20 2122  SARSCOV2NAA NEGATIVE    ------------------------------------------------------------------------------------------------------------------ No results for input(s): CHOL, HDL, LDLCALC, TRIG, CHOLHDL, LDLDIRECT in the last 72 hours.  No results found for: HGBA1C ------------------------------------------------------------------------------------------------------------------ No results for input(s): TSH, T4TOTAL, T3FREE, THYROIDAB in the last 72 hours.  Invalid input(s): FREET3 ------------------------------------------------------------------------------------------------------------------ Recent Labs    09/27/20 1940  FERRITIN 23  TIBC 324  IRON 12*  Coagulation profile  No results for input(s): INR, PROTIME in the last 168 hours.  No results for input(s): DDIMER in the last 72 hours.  Cardiac Enzymes  No results for input(s): CKMB, TROPONINI, MYOGLOBIN in the last 168 hours.  Invalid input(s): CK ------------------------------------------------------------------------------------------------------------------ No  results found for: BNP   Antibiotics: Anti-infectives (From admission, onward)   Start     Dose/Rate Route Frequency Ordered Stop   09/28/20 1000  vancomycin (VANCOREADY) IVPB 1500 mg/300 mL        1,500 mg 150 mL/hr over 120 Minutes Intravenous Every 12 hours 09/27/20 2246     09/28/20 0600  ceFEPIme (MAXIPIME) 1 g in sodium chloride 0.9 % 100 mL IVPB  Status:  Discontinued        1 g 200 mL/hr over 30 Minutes Intravenous Every 8 hours 09/27/20 2153 09/27/20 2229   09/28/20 0600  ceFEPIme (MAXIPIME) 2 g in sodium chloride 0.9 % 100 mL IVPB        2 g 200 mL/hr over 30 Minutes Intravenous Every 8 hours 09/27/20 2230     09/27/20 2245  vancomycin (VANCOREADY) IVPB 1500 mg/300 mL        1,500 mg 150 mL/hr over 120 Minutes Intravenous Once 09/27/20 2237 09/28/20 0057   09/27/20 2230  Vancomycin (VANCOCIN) 1,500 mg in sodium chloride 0.9 % 500 mL IVPB  Status:  Discontinued        1,500 mg 250 mL/hr over 120 Minutes Intravenous  Once 09/27/20 2226 09/27/20 2235   09/27/20 2115  vancomycin (VANCOCIN) IVPB 1000 mg/200 mL premix        1,000 mg 200 mL/hr over 60 Minutes Intravenous  Once 09/27/20 2107 09/27/20 2244   09/27/20 2115  ceFEPIme (MAXIPIME) 2 g in sodium chloride 0.9 % 100 mL IVPB        2 g 200 mL/hr over 30 Minutes Intravenous  Once 09/27/20 2107 09/27/20 2151       Radiology Reports  No results found.    DVT prophylaxis: Lovenox  Code Status: Full code  Family Communication: No family at bedside   Consultants:    Procedures:      Objective    Physical Examination:    General-appears in no acute distress  Heart-S1-S2, regular, no murmur auscultated  Lungs-clear to auscultation bilaterally, no wheezing or crackles auscultated  Abdomen-soft, nontender, no organomegaly  Extremities-no edema in the lower extremities  Neuro-alert, oriented x3, no focal deficit noted  Skin-bilateral large lower extremity wound noted in the calf region, very  minimal erythema around wounds.         Status is: Inpatient  Dispo: The patient is from: Home              Anticipated d/c is to: Home              Anticipated d/c date is: 09/30/2020              Patient currently not stable for discharge  Barrier to discharge-ongoing management for chronic lower extremity wound  COVID-19 Labs  Recent Labs    09/27/20 1940  FERRITIN 23    Lab Results  Component Value Date   SARSCOV2NAA NEGATIVE 09/27/2020    Microbiology  Recent Results (from the past 240 hour(s))  Blood culture (routine x 2)     Status: None (Preliminary result)   Collection Time: 09/27/20  7:40 PM   Specimen: BLOOD  Result Value Ref Range Status   Specimen Description  Final    BLOOD BLOOD LEFT FOREARM Performed at [facility], [address], [place], [place] [address]    Special Requests   Final    BOTTLES DRAWN AEROBIC AND ANAEROBIC Blood Culture adequate volume Performed at [facility], [address], [place], [place] [address]    Culture   Final    NO GROWTH < 12 HOURS Performed at [facility], [address], [place], [place] [address]    Report Status PENDING  Incomplete  Blood culture (routine x 2)     Status: None (Preliminary result)   Collection Time: 09/27/20  7:40 PM   Specimen: BLOOD  Result Value Ref Range Status   Specimen Description   Final    BLOOD RIGHT ANTECUBITAL Performed at [facility], [address], [place], [place] [address]    Special Requests   Final    BOTTLES DRAWN AEROBIC AND ANAEROBIC Blood Culture results may not be optimal due to an inadequate volume of blood received in culture bottles   Culture   Final    NO GROWTH < 12 HOURS Performed at [facility], [address], [place], [place] [address]    Report Status PENDING  Incomplete  Resp Panel by RT-PCR (Flu A&B, Covid) Nasopharyngeal Swab     Status: None   Collection Time: 09/27/20   9:22 PM   Specimen: Nasopharyngeal Swab; Nasopharyngeal(NP) swabs in vial transport medium  Result Value Ref Range Status   SARS Coronavirus 2 by RT PCR NEGATIVE NEGATIVE Final    Comment: (NOTE) SARS-CoV-2 target nucleic acids are NOT DETECTED.  The SARS-CoV-2 RNA is generally detectable in upper respiratory specimens during the acute phase of infection. The lowest concentration of SARS-CoV-2 viral copies this assay can detect is 138 copies/mL. A negative result does not preclude SARS-Cov-2 infection and should not be used as the sole basis for treatment or other patient management decisions. A negative result may occur with  improper specimen collection/handling, submission of specimen other than nasopharyngeal swab, presence of viral mutation(s) within the areas targeted by this assay, and inadequate number of viral copies(<138 copies/mL). A negative result must be combined with clinical observations, patient history, and epidemiological information. The expected result is Negative.  Fact Sheet for Patients:  BloggerCourse.com  Fact Sheet for Healthcare Providers:  SeriousBroker.it  This test is no t yet approved or cleared by the [place] FDA and  has been authorized for detection and/or diagnosis of SARS-CoV-2 by FDA under an Emergency Use Authorization (EUA). This EUA will remain  in effect (meaning this test can be used) for the duration of the COVID-19 declaration under Section 564(b)(1) of the Act, 21 U.S.C.section 360bbb-3(b)(1), unless the authorization is terminated  or revoked sooner.       Influenza A by PCR NEGATIVE NEGATIVE Final   Influenza B by PCR NEGATIVE NEGATIVE Final    Comment: (NOTE) The Xpert Xpress SARS-CoV-2/FLU/RSV plus assay is intended as an aid in the diagnosis of influenza from Nasopharyngeal swab specimens and should not be used as a sole basis for treatment. Nasal washings and aspirates  are unacceptable for Xpert Xpress SARS-CoV-2/FLU/RSV testing.  Fact Sheet for Patients: BloggerCourse.com  Fact Sheet for Healthcare Providers: SeriousBroker.it  This test is not yet approved or cleared by the [place] FDA and has been authorized for detection and/or diagnosis of SARS-CoV-2 by FDA under an Emergency Use Authorization (EUA). This EUA will remain in effect (meaning  this test can be used) for the duration of the COVID-19 declaration under Section 564(b)(1) of the Act, 21 U.S.C. section 360bbb-3(b)(1), unless the authorization is terminated or revoked.  Performed at [facility], [address], [place], [place] [address]              Meredeth Ide   [facility] If 7PM-7AM, please contact night-coverage at [email or web address], Office  [phone]   09/28/2020, 4:54 PM  LOS: 0 days

## 2020-09-28 NOTE — Consult Note (Signed)
WOC Nurse wound consult note Consultation was completed by review of records, images and assistance from the bedside nurse/clinical staff.   Reason for Consult:LE wounds Chronic LE wounds in the presence of lymphedema. Noted she is treated at home for wound care.  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  Do not saturate, do not use saline. Cover with ABD pads, secure with kerlix and 4" kerlix wrapped from toes to popliteal area.  Change daily every other day  Patient to follow up in wound care center of her choice for long term management of LE wounds   Mckaylin Bastien [facility] MSN,RN,CCN, CNS, CWON-AP [phone]

## 2020-09-28 NOTE — Progress Notes (Signed)
Patient observed in bed scratching wounds. Asked not to scratch and to wash hands before and after touching wounds.

## 2020-09-29 DIAGNOSIS — D509 Iron deficiency anemia, unspecified: Secondary | ICD-10-CM

## 2020-09-29 LAB — CBC
HCT: 20.5 % — ABNORMAL LOW (ref 36.0–46.0)
Hemoglobin: 6.1 g/dL — CL (ref 12.0–15.0)
MCH: 21.2 pg — ABNORMAL LOW (ref 26.0–34.0)
MCHC: 29.8 g/dL — ABNORMAL LOW (ref 30.0–36.0)
MCV: 71.2 fL — ABNORMAL LOW (ref 80.0–100.0)
Platelets: 436 10*3/uL — ABNORMAL HIGH (ref 150–400)
RBC: 2.88 MIL/uL — ABNORMAL LOW (ref 3.87–5.11)
RDW: 18.6 % — ABNORMAL HIGH (ref 11.5–15.5)
WBC: 13.1 10*3/uL — ABNORMAL HIGH (ref 4.0–10.5)
nRBC: 0 % (ref 0.0–0.2)

## 2020-09-29 LAB — PREPARE RBC (CROSSMATCH)

## 2020-09-29 LAB — ABO/RH: ABO/RH(D): O POS

## 2020-09-29 MED ORDER — SODIUM CHLORIDE 0.9% IV SOLUTION: Freq: Once | INTRAVENOUS | Status: DC

## 2020-09-29 MED ORDER — SODIUM CHLORIDE 0.9% IV SOLUTION: Freq: Once | INTRAVENOUS | Status: AC

## 2020-09-29 MED ORDER — HYDROMORPHONE HCL 1 MG/ML IJ SOLN
1.0000 mg | Freq: Once | INTRAMUSCULAR | Status: AC
  Administered 2020-09-29: 1 mg via INTRAVENOUS
  Filled 2020-09-29: qty 1

## 2020-09-29 MED ORDER — HYDROMORPHONE HCL 1 MG/ML IJ SOLN
0.5000 mg | Freq: Once | INTRAMUSCULAR | Status: AC | PRN
  Administered 2020-09-29: 0.5 mg via INTRAVENOUS
  Filled 2020-09-29: qty 0.5

## 2020-09-29 MED ORDER — KETOROLAC TROMETHAMINE 30 MG/ML IJ SOLN
30.0000 mg | Freq: Once | INTRAMUSCULAR | Status: AC
  Administered 2020-09-29: 30 mg via INTRAVENOUS
  Filled 2020-09-29: qty 1

## 2020-09-29 MED ORDER — HYDROMORPHONE HCL 1 MG/ML IJ SOLN
1.0000 mg | INTRAMUSCULAR | Status: DC | PRN
  Administered 2020-09-29 – 2020-10-01 (×7): 1 mg via INTRAVENOUS
  Filled 2020-09-29 (×7): qty 1

## 2020-09-29 MED ORDER — MELATONIN 3 MG PO TABS
3.0000 mg | ORAL_TABLET | Freq: Every day | ORAL | Status: DC
  Administered 2020-09-30 (×2): 3 mg via ORAL
  Filled 2020-09-29 (×2): qty 1

## 2020-09-29 NOTE — Progress Notes (Signed)
[facility]  PROGRESS NOTE  [name] [name] VZD:[id number] DOB: 18-Jul-1990 DOA: 09/27/2020 PCP: Pcp, No   Brief HPI:   30 year old female with past medical history of chronic lymphedema, chronic bilateral lower extremity wounds, chronic pain syndrome came to the ED with complaints of worsening bilateral lower extremity pain.  Patient has chronic wounds on lower extremity and has been having pain, she noted more erythema, edema and small amount of drainage. Patient was started on IV antibiotics, vancomycin and cefepime   Subjective   Patient seen and examined, complains of pain in lower extremities.   Assessment/Plan:     1. Acute on chronic bilateral lower extremity wound/ulcer-patient presented with worsening of the erythema and pain in the chronic lower extremity wounds.  She has history of lymphedema.  Patient empirically started on vancomycin and cefepime.  Appreciate wound care recommendation, recommended to cover the wound with silver Hydrofiber.  2. Iron deficiency anemia-patient's hemoglobin on presentation was 7.6, serum iron 12, TIBC 324, saturation 4, ferritin 23.  This morning hemoglobin is down to 6.1.  Will transfuse with 2 units PRBC.  Patient has been started on p.o. ferrous gluconate 324 mg daily.    Unclear etiology, will check FOBT.  Patient says that she has IUD in place for past 2 years.  So not having heavy periods.  3. Chronic bilateral lower extremity edema/lymphedema-stable  4. Morbid obesity-BMI 45 kg/m  5. Chronic pain syndrome-continue home medication including hydrocodone, gabapentin, duloxetine.  We will start Dilaudid 1 mg IV every 4 hours as needed for pain.  Scheduled medications:   . sodium chloride   Intravenous Once  . DULoxetine  90 mg Oral Daily  . enoxaparin (LOVENOX) injection  60 mg Subcutaneous Q24H  . ferrous gluconate  324 mg Oral Q breakfast  . gabapentin  600 mg Oral TID  . melatonin  3 mg Oral QHS         Data Reviewed:    CBG:  Recent Labs  Lab 09/27/20 1826  GLUCAP 85    SpO2: 99 %    Vitals:   09/28/20 0614 09/28/20 1345 09/28/20 2015 09/29/20 0439  BP: 125/61 (!) 108/53 (!) 112/47 (!) 101/58  Pulse: 86 83 90 78  Resp: 18 17 16 15   Temp: 98.3 F (36.8 C) 98.6 F (37 C) 99.9 F (37.7 C) 98.6 F (37 C)  TempSrc:    Oral  SpO2: 100% 100% 98% 99%  Weight:      Height:         Intake/Output Summary (Last 24 hours) at 09/29/2020 1218 Last data filed at 09/29/2020 0600 Gross per 24 hour  Intake 1445.19 ml  Output 0 ml  Net 1445.19 ml    04/17 1901 - 04/19 0700 In: 2105.2 [P.O.:720; I.V.:240] Out: 0   Filed Weights   09/27/20 2214  Weight: 131.1 kg    CBC:  Recent Labs  Lab 09/27/20 1940 09/29/20 0431  WBC 11.0* 13.1*  HGB 7.6* 6.1*  HCT 25.6* 20.5*  PLT 513* 436*  MCV 72.3* 71.2*  MCH 21.5* 21.2*  MCHC 29.7* 29.8*  RDW 18.6* 18.6*  LYMPHSABS 1.5  --   MONOABS 0.9  --   EOSABS 0.2  --   BASOSABS 0.0  --     Complete metabolic panel:  Recent Labs  Lab 09/27/20 1940  NA 135  K 4.2  CL 104  CO2 24  GLUCOSE 97  BUN 13  CREATININE 0.54  CALCIUM 8.8*  AST 16  ALT  13  ALKPHOS 97  BILITOT 0.3  ALBUMIN 2.7*  LATICACIDVEN 0.9    No results for input(s): LIPASE, AMYLASE in the last 168 hours.  Recent Labs  Lab 09/27/20 2122  SARSCOV2NAA NEGATIVE    ------------------------------------------------------------------------------------------------------------------ No results for input(s): CHOL, HDL, LDLCALC, TRIG, CHOLHDL, LDLDIRECT in the last 72 hours.  No results found for: HGBA1C ------------------------------------------------------------------------------------------------------------------ No results for input(s): TSH, T4TOTAL, T3FREE, THYROIDAB in the last 72 hours.  Invalid input(s): FREET3 ------------------------------------------------------------------------------------------------------------------ Recent Labs    09/27/20 1940   FERRITIN 23  TIBC 324  IRON 12*    Coagulation profile  No results for input(s): INR, PROTIME in the last 168 hours.  No results for input(s): DDIMER in the last 72 hours.  Cardiac Enzymes  No results for input(s): CKMB, TROPONINI, MYOGLOBIN in the last 168 hours.  Invalid input(s): CK ------------------------------------------------------------------------------------------------------------------ No results found for: BNP   Antibiotics: Anti-infectives (From admission, onward)   Start     Dose/Rate Route Frequency Ordered Stop   09/28/20 1000  vancomycin (VANCOREADY) IVPB 1500 mg/300 mL        1,500 mg 150 mL/hr over 120 Minutes Intravenous Every 12 hours 09/27/20 2246     09/28/20 0600  ceFEPIme (MAXIPIME) 1 g in sodium chloride 0.9 % 100 mL IVPB  Status:  Discontinued        1 g 200 mL/hr over 30 Minutes Intravenous Every 8 hours 09/27/20 2153 09/27/20 2229   09/28/20 0600  ceFEPIme (MAXIPIME) 2 g in sodium chloride 0.9 % 100 mL IVPB        2 g 200 mL/hr over 30 Minutes Intravenous Every 8 hours 09/27/20 2230     09/27/20 2245  vancomycin (VANCOREADY) IVPB 1500 mg/300 mL        1,500 mg 150 mL/hr over 120 Minutes Intravenous Once 09/27/20 2237 09/28/20 0057   09/27/20 2230  Vancomycin (VANCOCIN) 1,500 mg in sodium chloride 0.9 % 500 mL IVPB  Status:  Discontinued        1,500 mg 250 mL/hr over 120 Minutes Intravenous  Once 09/27/20 2226 09/27/20 2235   09/27/20 2115  vancomycin (VANCOCIN) IVPB 1000 mg/200 mL premix        1,000 mg 200 mL/hr over 60 Minutes Intravenous  Once 09/27/20 2107 09/27/20 2244   09/27/20 2115  ceFEPIme (MAXIPIME) 2 g in sodium chloride 0.9 % 100 mL IVPB        2 g 200 mL/hr over 30 Minutes Intravenous  Once 09/27/20 2107 09/27/20 2151       Radiology Reports  No results found.    DVT prophylaxis: Lovenox  Code Status: Full code  Family Communication: No family at bedside   Consultants:    Procedures:      Objective     Physical Examination:    General-appears in no acute distress  Heart-S1-S2, regular, no murmur auscultated  Lungs-clear to auscultation bilaterally, no wheezing or crackles auscultated  Abdomen-soft, nontender, no organomegaly  Extremities-no edema in the lower extremities  Neuro-alert, oriented x3, no focal deficit noted  Skin-large ulcers noted on the lower extremities bilaterally as below     Status is: Inpatient  Dispo: The patient is from: Home              Anticipated d/c is to: Home              Anticipated d/c date is: 10/02/2020              Patient currently not  stable for discharge  Barrier to discharge-ongoing management for chronic lower extremity wound  COVID-19 Labs  Recent Labs    09/27/20 1940  FERRITIN 23    Lab Results  Component Value Date   SARSCOV2NAA NEGATIVE 09/27/2020    Microbiology  Recent Results (from the past 240 hour(s))  Blood culture (routine x 2)     Status: None (Preliminary result)   Collection Time: 09/27/20  7:40 PM   Specimen: BLOOD  Result Value Ref Range Status   Specimen Description   Final    BLOOD BLOOD LEFT FOREARM Performed at [facility], [address] [place], [place] [address]    Special Requests   Final    BOTTLES DRAWN AEROBIC AND ANAEROBIC Blood Culture adequate volume Performed at [facility], [address] [place], [place] [address]    Culture   Final    NO GROWTH 2 DAYS Performed at [facility], [address] [place], [place] [address]    Report Status PENDING  Incomplete  Blood culture (routine x 2)     Status: None (Preliminary result)   Collection Time: 09/27/20  7:40 PM   Specimen: BLOOD  Result Value Ref Range Status   Specimen Description   Final    BLOOD RIGHT ANTECUBITAL Performed at [facility], [address] [place], [place] [address]    Special Requests   Final    BOTTLES DRAWN AEROBIC AND ANAEROBIC  Blood Culture results may not be optimal due to an inadequate volume of blood received in culture bottles   Culture   Final    NO GROWTH 2 DAYS Performed at [facility], [address] [place], [place] [address]    Report Status PENDING  Incomplete  Resp Panel by RT-PCR (Flu A&B, Covid) Nasopharyngeal Swab     Status: None   Collection Time: 09/27/20  9:22 PM   Specimen: Nasopharyngeal Swab; Nasopharyngeal(NP) swabs in vial transport medium  Result Value Ref Range Status   SARS Coronavirus 2 by RT PCR NEGATIVE NEGATIVE Final    Comment: (NOTE) SARS-CoV-2 target nucleic acids are NOT DETECTED.  The SARS-CoV-2 RNA is generally detectable in upper respiratory specimens during the acute phase of infection. The lowest concentration of SARS-CoV-2 viral copies this assay can detect is 138 copies/mL. A negative result does not preclude SARS-Cov-2 infection and should not be used as the sole basis for treatment or other patient management decisions. A negative result may occur with  improper specimen collection/handling, submission of specimen other than nasopharyngeal swab, presence of viral mutation(s) within the areas targeted by this assay, and inadequate number of viral copies(<138 copies/mL). A negative result must be combined with clinical observations, patient history, and epidemiological information. The expected result is Negative.  Fact Sheet for Patients:  BloggerCourse.comhttps://www.fda.gov/media/152166/download  Fact Sheet for Healthcare Providers:  SeriousBroker.ithttps://www.fda.gov/media/152162/download  This test is no t yet approved or cleared by the [place] FDA and  has been authorized for detection and/or diagnosis of SARS-CoV-2 by FDA under an Emergency Use Authorization (EUA). This EUA will remain  in effect (meaning this test can be used) for the duration of the COVID-19 declaration under Section 564(b)(1) of the Act, 21 U.S.C.section 360bbb-3(b)(1), unless the authorization is  terminated  or revoked sooner.       Influenza A by PCR NEGATIVE NEGATIVE Final   Influenza B by PCR NEGATIVE NEGATIVE Final    Comment: (NOTE) The Xpert Xpress SARS-CoV-2/FLU/RSV plus  assay is intended as an aid in the diagnosis of influenza from Nasopharyngeal swab specimens and should not be used as a sole basis for treatment. Nasal washings and aspirates are unacceptable for Xpert Xpress SARS-CoV-2/FLU/RSV testing.  Fact Sheet for Patients: BloggerCourse.com  Fact Sheet for Healthcare Providers: SeriousBroker.it  This test is not yet approved or cleared by the [place] FDA and has been authorized for detection and/or diagnosis of SARS-CoV-2 by FDA under an Emergency Use Authorization (EUA). This EUA will remain in effect (meaning this test can be used) for the duration of the COVID-19 declaration under Section 564(b)(1) of the Act, 21 U.S.C. section 360bbb-3(b)(1), unless the authorization is terminated or revoked.  Performed at [facility], [address] [place], [place] [address]         Meredeth Ide   [facility] If 7PM-7AM, please contact night-coverage at [email or web address], Office  [phone]   09/29/2020, 12:18 PM  LOS: 1 day

## 2020-09-29 NOTE — TOC Initial Note (Signed)
Transition of Care [facility]) - Initial/Assessment Note   Patient Details  Name: [name] [name] MRN: [id number] Date of Birth: [birth year]  [facility]) CM/SW Contact:    Sherie Don, LCSW Phone Number: 09/29/2020, 11:22 AM  Clinical Narrative: Patient is a 30 year old female who was admitted for cellulitis. Patient has a history of chronic pain syndrome, bilateral edema of lower extremity, obesity, and lymphedema. WOC note recommends patient follow up with wound care center of choice.  CSW met with patient to discuss wound care. Per patient, she [facility]. The wound care center is aware the patient has been hospitalized and she was instructed to schedule a follow up appointment once she discharges. Patient reported it takes 2 weeks to get her transportation set up through her Medicaid, so she should be seen within approximately 2 weeks of discharge. TOC to follow.   Expected Discharge Plan: Home/Self Care Barriers to Discharge: Continued Medical Work up  Patient Goals and CMS Choice Patient states their goals for this hospitalization and ongoing recovery are:: Discharge home and schedule wound care appointment at [facility] Choice offered to / list presented to : NA  Expected Discharge Plan and Services Expected Discharge Plan: Home/Self Care In-house Referral: Clinical Social Work Post Acute Care Choice: NA Living arrangements for the past 2 months: Apartment              DME Arranged: N/A DME Agency: NA  Prior Living Arrangements/Services Living arrangements for the past 2 months: Apartment Lives with:: Self Patient language and need for interpreter reviewed:: Yes Do you feel safe going back to the place where you live?: Yes      Need for Family Participation in Patient Care: No (Comment) Care giver support system in place?: Yes (comment) Criminal Activity/Legal Involvement Pertinent to Current  Situation/Hospitalization: No - Comment as needed  Activities of Daily Living Home Assistive Devices/Equipment: None ADL Screening (condition at time of admission) Patient's cognitive ability adequate to safely complete daily activities?: Yes Is the patient deaf or have difficulty hearing?: No Does the patient have difficulty seeing, even when wearing glasses/contacts?: No Does the patient have difficulty concentrating, remembering, or making decisions?: No Patient able to express need for assistance with ADLs?: Yes Does the patient have difficulty dressing or bathing?: No Independently performs ADLs?: Yes (appropriate for developmental age) Does the patient have difficulty walking or climbing stairs?: No Weakness of Legs: None Weakness of Arms/Hands: None  Emotional Assessment Appearance:: Appears stated age Attitude/Demeanor/Rapport: Engaged Affect (typically observed): Accepting Orientation: : Oriented to Self,Oriented to Place,Oriented to  Time,Oriented to Situation Psych Involvement: No (comment)  Admission diagnosis:  Cellulitis [L03.90] Cellulitis, unspecified cellulitis site [L03.90] Lymphedema [I89.0] Patient Active Problem List   Diagnosis Date Noted  . Lymphedema 09/28/2020  . Cellulitis 09/27/2020  . Chronic pain syndrome 09/27/2020  . Bilateral edema of lower extremity 09/27/2020  . Anemia 09/27/2020  . Obesity, Class III, BMI 40-49.9 (morbid obesity) ([place]) 09/27/2020   PCP:  Pcp, No Pharmacy:   [facility] [phone] - [place], [address] AT [address] [place] [address] Phone: [phone] Fax: [phone]  Readmission Risk Interventions No flowsheet data found.

## 2020-09-29 NOTE — Progress Notes (Signed)
Date and time results received: 09/29/20 at 0509  Critical Value: Hgb-6.1  Name of Provider Notified: Dr Lavone Neri Opyd 09/29/2020 at 0516 Paged and secure chat  Orders Received? Or Actions Taken?:  Waiting for response.

## 2020-09-29 NOTE — Progress Notes (Signed)
Patient wound dressing was saturated. Administered PRN morphine before dressing change and administered oxycodone 10 mg after dressing change. Patient still c/o pain 10/10 and crying.On call MD Notified. Will continue to monitor.

## 2020-09-30 ENCOUNTER — Encounter (HOSPITAL_COMMUNITY): Payer: Self-pay | Admitting: Family Medicine

## 2020-09-30 LAB — BASIC METABOLIC PANEL
Anion gap: 9 (ref 5–15)
BUN: 10 mg/dL (ref 6–20)
CO2: 21 mmol/L — ABNORMAL LOW (ref 22–32)
Calcium: 8.3 mg/dL — ABNORMAL LOW (ref 8.9–10.3)
Chloride: 102 mmol/L (ref 98–111)
Creatinine, Ser: 0.43 mg/dL — ABNORMAL LOW (ref 0.44–1.00)
GFR, Estimated: >60 mL/min (ref >60)
Glucose, Bld: 81 mg/dL (ref 70–99)
Potassium: 3.7 mmol/L (ref 3.5–5.1)
Sodium: 132 mmol/L — ABNORMAL LOW (ref 135–145)

## 2020-09-30 LAB — CBC
HCT: 29.7 % — ABNORMAL LOW (ref 36.0–46.0)
Hemoglobin: 9.1 g/dL — ABNORMAL LOW (ref 12.0–15.0)
MCH: 22.9 pg — ABNORMAL LOW (ref 26.0–34.0)
MCHC: 30.6 g/dL (ref 30.0–36.0)
MCV: 74.6 fL — ABNORMAL LOW (ref 80.0–100.0)
Platelets: 442 10*3/uL — ABNORMAL HIGH (ref 150–400)
RBC: 3.98 MIL/uL (ref 3.87–5.11)
RDW: 19.5 % — ABNORMAL HIGH (ref 11.5–15.5)
WBC: 7.8 10*3/uL (ref 4.0–10.5)
nRBC: 0 % (ref 0.0–0.2)

## 2020-09-30 LAB — TYPE AND SCREEN
ABO/RH(D): O POS
Antibody Screen: NEGATIVE
Unit division: 0
Unit division: 0

## 2020-09-30 LAB — BPAM RBC
Blood Product Expiration Date: 202205132359
Blood Product Expiration Date: 202205152359
ISSUE DATE / TIME: 202204191234
ISSUE DATE / TIME: 202204191706
Unit Type and Rh: 5100
Unit Type and Rh: 5100

## 2020-09-30 NOTE — Progress Notes (Signed)
Pharmacy Antibiotic Note  [name] [name] is a 30 y.o. female admitted on 09/27/2020 with cellulitis.  PMH significant for DM, chronic lower extremity wounds.  Upon admission, pharmacy consulted for Vancomycin dosing. Patient also placed on Cefepime per MD.   Plan: Continue Vancomycin 1500mg  IV q12h. Goal AUC 400-550.  Expected AUC: 532.9  SCr used: 0.8 (rounded up from 0.43) Vancomycin levels as indicated Cefepime 2g IV q8h per MD  Monitor renal function, cultures, clinical course, duration of therapy   Height: 5\' 7"  (170.2 cm) Weight: 131.1 kg (289 lb) IBW/kg (Calculated) : 61.6  Temp (24hrs), Avg:98.2 F (36.8 C), Min:97.7 F (36.5 C), Max:98.6 F (37 C)  Recent Labs  Lab 09/27/20 1940 09/29/20 0431 09/30/20 0016  WBC 11.0* 13.1* 7.8  CREATININE 0.54  --  0.43*  LATICACIDVEN 0.9  --   --     Estimated Creatinine Clearance: 142.5 mL/min (A) (by C-G formula based on SCr of 0.43 mg/dL (L)).    Allergies  Allergen Reactions  . Silver     Antimicrobials this admission: 4/17 Cefepime >>   4/17 Vancomycin >>    Microbiology results: 4/17 BCx: NGTD 4/20 wound cx: ordered  Thank you for allowing pharmacy to be a part of this patient's care.   5/17, PharmD, BCPS Clinical Pharmacist  09/30/2020 2:20 PM

## 2020-09-30 NOTE — Progress Notes (Signed)
PROGRESS NOTE    [name] GustinBrittany Leu  WUJ:[id number] DOB: 02/01/1991 DOA: 09/27/2020 PCP: Pcp, No   Brief Narrative: 30 year old female with past medical history of chronic lymphedema, chronic bilateral lower extremity wounds, chronic pain syndrome came to the ED with complaints of worsening bilateral lower extremity pain.  Patient has chronic wounds on lower extremity and has been having pain, she noted more erythema, edema and small amount of drainage. Patient was started on IV antibiotics, vancomycin and cefepime  Assessment & Plan:   Active Problems:   Cellulitis   Chronic pain syndrome   Bilateral edema of lower extremity   Anemia   Obesity, Class III, BMI 40-49.9 (morbid obesity) (HCC)   Lymphedema  #1 venous stasis wounds/bilateral lower extremity wounds in the setting of lymphedema-patient with history of lymphedema and chronic bilateral wounds.  She came into the ER with complaints of increased pain swelling and drainage from both wounds.  She has been taking oral narcotics at home which did not help her pain.  When she first arrived to the hospital she was tachycardic at a rate of 112 and afebrile.  She was started on vancomycin and cefepime.  I do not see a wound culture. Patient has been seen by wound care and has recommended silver Hydrofiber. Staff reports she is in excruciating pain 10 out of 10 in spite of IV Dilaudid and oral narcotics.  #2 iron deficiency anemia her hemoglobin dropped to 6.1 and was transfused 2 units of packed RBCs.  Patient denies having any hematuria hematochezia melena hematemesis vaginal bleeding or rectal bleeding.  She does report that she has an IUD in for the last 2 years.  Her hemoglobin has come up to 9.1.  I will hold off on Feraheme due to infection as above. Check FOBT.  #3 morbid obesity BMI of 45.  #4 acute on chronic pain syndrome-continue Dilaudid IV, continue hydrocodone gabapentin and duloxetine which she was taking at home.   Estimated  body mass index is 45.26 kg/m as calculated from the following:   Height as of this encounter: 5\' 7"  (1.702 m).   Weight as of this encounter: 131.1 kg.  DVT prophylaxis: Lovenox Code Status: Full code  Family Communication: None at bedside  disposition Plan:  Status is: Inpatient  Dispo: The patient is from home              Anticipated d/c is to: Home              Patient currently not medically stable   Difficult to place patient no   Consultants:   Wound care  Procedures: None Antimicrobials: Vancomycin and cefepime  Subjective:  She is resting in bed complaining of 10 out of 10 bilateral lower extremity pain asking for pain medication.  At baseline she reports that she is able to walk. Objective: Vitals:   09/29/20 2126 09/29/20 2158 09/29/20 2246 09/30/20 0524  BP: (!) 102/57 109/63 110/64 (!) 93/52  Pulse: 62 61 (!) 58 (!) 53  Resp: 15 14  17   Temp: 98.5 F (36.9 C) 98.3 F (36.8 C)  97.7 F (36.5 C)  TempSrc: Oral Oral  Oral  SpO2: 100% 100%  100%  Weight:      Height:        Intake/Output Summary (Last 24 hours) at 09/30/2020 1051 Last data filed at 09/30/2020 1014 Gross per 24 hour  Intake 1818 ml  Output --  Net 1818 ml   Filed Weights   09/27/20 2214  Weight: 131.1 kg    Examination:  General exam: Appears in distress due to pain  respiratory system: Clear to auscultation. Respiratory effort normal. Cardiovascular system: S1 & S2 heard, RRR. No JVD, murmurs, rubs, gallops or clicks. No pedal edema. Gastrointestinal system: Abdomen is nondistended, soft and nontender. No organomegaly or masses felt. Normal bowel sounds heard. Central nervous system: Alert and oriented. No focal neurological deficits. Extremities: Bilateral lower extremities with lymphedema and ulcers on the posterior aspect of bilateral lower extremities.(Picture noted) Lesions are covered with dressings. Skin: Large ulcers in the posterior aspect of bilateral calf. Psychiatry:  Judgement and insight appear normal. Mood & affect appropriate.     Data Reviewed: I have personally reviewed following labs and imaging studies  CBC: Recent Labs  Lab 09/27/20 1940 09/29/20 0431 09/30/20 0016  WBC 11.0* 13.1* 7.8  NEUTROABS 8.3*  --   --   HGB 7.6* 6.1* 9.1*  HCT 25.6* 20.5* 29.7*  MCV 72.3* 71.2* 74.6*  PLT 513* 436* 442*   Basic Metabolic Panel: Recent Labs  Lab 09/27/20 1940 09/30/20 0016  NA 135 132*  K 4.2 3.7  CL 104 102  CO2 24 21*  GLUCOSE 97 81  BUN 13 10  CREATININE 0.54 0.43*  CALCIUM 8.8* 8.3*   GFR: Estimated Creatinine Clearance: 142.5 mL/min (A) (by C-G formula based on SCr of 0.43 mg/dL (L)). Liver Function Tests: Recent Labs  Lab 09/27/20 1940  AST 16  ALT 13  ALKPHOS 97  BILITOT 0.3  PROT 8.2*  ALBUMIN 2.7*   No results for input(s): LIPASE, AMYLASE in the last 168 hours. No results for input(s): AMMONIA in the last 168 hours. Coagulation Profile: No results for input(s): INR, PROTIME in the last 168 hours. Cardiac Enzymes: No results for input(s): CKTOTAL, CKMB, CKMBINDEX, TROPONINI in the last 168 hours. BNP (last 3 results) No results for input(s): PROBNP in the last 8760 hours. HbA1C: No results for input(s): HGBA1C in the last 72 hours. CBG: Recent Labs  Lab 09/27/20 1826  GLUCAP 85   Lipid Profile: No results for input(s): CHOL, HDL, LDLCALC, TRIG, CHOLHDL, LDLDIRECT in the last 72 hours. Thyroid Function Tests: No results for input(s): TSH, T4TOTAL, FREET4, T3FREE, THYROIDAB in the last 72 hours. Anemia Panel: Recent Labs    09/27/20 1940  FERRITIN 23  TIBC 324  IRON 12*   Sepsis Labs: Recent Labs  Lab 09/27/20 1940  LATICACIDVEN 0.9    Recent Results (from the past 240 hour(s))  Blood culture (routine x 2)     Status: None (Preliminary result)   Collection Time: 09/27/20  7:40 PM   Specimen: BLOOD  Result Value Ref Range Status   Specimen Description   Final    BLOOD BLOOD LEFT  FOREARM Performed at [facility], [address] [place], [place] [address]    Special Requests   Final    BOTTLES DRAWN AEROBIC AND ANAEROBIC Blood Culture adequate volume Performed at [facility], [address], [place], [place] [address]    Culture   Final    NO GROWTH 3 DAYS Performed at [facility], [address] [place], [place] [address]    Report Status PENDING  Incomplete  Blood culture (routine x 2)     Status: None (Preliminary result)   Collection Time: 09/27/20  7:40 PM   Specimen: BLOOD  Result Value Ref Range Status   Specimen Description   Final    BLOOD RIGHT ANTECUBITAL  Performed at [facility], [address], [place], [place] [address]    Special Requests   Final    BOTTLES DRAWN AEROBIC AND ANAEROBIC Blood Culture results may not be optimal due to an inadequate volume of blood received in culture bottles   Culture   Final    NO GROWTH 3 DAYS Performed at [facility], [address] [place], [place] [address]    Report Status PENDING  Incomplete  Resp Panel by RT-PCR (Flu A&B, Covid) Nasopharyngeal Swab     Status: None   Collection Time: 09/27/20  9:22 PM   Specimen: Nasopharyngeal Swab; Nasopharyngeal(NP) swabs in vial transport medium  Result Value Ref Range Status   SARS Coronavirus 2 by RT PCR NEGATIVE NEGATIVE Final    Comment: (NOTE) SARS-CoV-2 target nucleic acids are NOT DETECTED.  The SARS-CoV-2 RNA is generally detectable in upper respiratory specimens during the acute phase of infection. The lowest concentration of SARS-CoV-2 viral copies this assay can detect is 138 copies/mL. A negative result does not preclude SARS-Cov-2 infection and should not be used as the sole basis for treatment or other patient management decisions. A negative result may occur with  improper specimen collection/handling, submission of specimen other than nasopharyngeal swab, presence of  viral mutation(s) within the areas targeted by this assay, and inadequate number of viral copies(<138 copies/mL). A negative result must be combined with clinical observations, patient history, and epidemiological information. The expected result is Negative.  Fact Sheet for Patients:  BloggerCourse.com  Fact Sheet for Healthcare Providers:  SeriousBroker.it  This test is no t yet approved or cleared by the [place] FDA and  has been authorized for detection and/or diagnosis of SARS-CoV-2 by FDA under an Emergency Use Authorization (EUA). This EUA will remain  in effect (meaning this test can be used) for the duration of the COVID-19 declaration under Section 564(b)(1) of the Act, 21 U.S.C.section 360bbb-3(b)(1), unless the authorization is terminated  or revoked sooner.       Influenza A by PCR NEGATIVE NEGATIVE Final   Influenza B by PCR NEGATIVE NEGATIVE Final    Comment: (NOTE) The Xpert Xpress SARS-CoV-2/FLU/RSV plus assay is intended as an aid in the diagnosis of influenza from Nasopharyngeal swab specimens and should not be used as a sole basis for treatment. Nasal washings and aspirates are unacceptable for Xpert Xpress SARS-CoV-2/FLU/RSV testing.  Fact Sheet for Patients: BloggerCourse.com  Fact Sheet for Healthcare Providers: SeriousBroker.it  This test is not yet approved or cleared by the [place] FDA and has been authorized for detection and/or diagnosis of SARS-CoV-2 by FDA under an Emergency Use Authorization (EUA). This EUA will remain in effect (meaning this test can be used) for the duration of the COVID-19 declaration under Section 564(b)(1) of the Act, 21 U.S.C. section 360bbb-3(b)(1), unless the authorization is terminated or revoked.  Performed at [facility], [address] [place], [place] [address]           Radiology Studies: No results found.      Scheduled Meds: . sodium chloride   Intravenous Once  . DULoxetine  90 mg Oral Daily  . enoxaparin (LOVENOX) injection  60 mg Subcutaneous Q24H  . ferrous gluconate  324 mg Oral Q breakfast  . gabapentin  600 mg Oral TID  . melatonin  3 mg Oral QHS   Continuous Infusions: . ceFEPime (MAXIPIME) IV 2 g (09/30/20 0553)  . vancomycin HCl 1,500 mg (09/30/20 0006)  LOS: 2 days   Alwyn Ren, MD 09/30/2020, 10:51 AM

## 2020-10-01 MED ORDER — ONDANSETRON HCL 4 MG PO TABS: 4.0000 mg | ORAL_TABLET | Freq: Four times a day (QID) | ORAL | 0 refills | Status: AC | PRN

## 2020-10-01 MED ORDER — AMOXICILLIN-POT CLAVULANATE 875-125 MG PO TABS: 1.0000 | ORAL_TABLET | Freq: Two times a day (BID) | ORAL | 0 refills | Status: AC

## 2020-10-01 MED ORDER — OXYCODONE HCL 10 MG PO TABS: 10.0000 mg | ORAL_TABLET | ORAL | 0 refills | Status: DC | PRN

## 2020-10-01 MED ORDER — DOXYCYCLINE HYCLATE 50 MG PO CAPS: 100.0000 mg | ORAL_CAPSULE | Freq: Two times a day (BID) | ORAL | 0 refills | Status: DC

## 2020-10-01 MED ORDER — POLYETHYLENE GLYCOL 3350 17 G PO PACK: 17.0000 g | PACK | Freq: Every day | ORAL | Status: DC

## 2020-10-01 MED ORDER — FERROUS GLUCONATE 324 (38 FE) MG PO TABS: 324.0000 mg | ORAL_TABLET | Freq: Every day | ORAL | 3 refills | Status: AC

## 2020-10-01 MED ORDER — POLYETHYLENE GLYCOL 3350 17 G PO PACK: 17.0000 g | PACK | Freq: Every day | ORAL | 0 refills | Status: DC

## 2020-10-01 MED ORDER — VITAMIN C 250 MG PO TABS: 500.0000 mg | ORAL_TABLET | Freq: Two times a day (BID) | ORAL | Status: DC

## 2020-10-01 NOTE — TOC Transition Note (Signed)
Transition of Care [facility]) - CM/SW Discharge Note  Patient Details  Name: [name] [name] MRN: [id number] Date of Birth: April 11, 1991  Transition of Care [facility]) CM/SW Contact:  Ewing Schlein, LCSW Phone Number: 10/01/2020, 1:28 PM  Clinical Narrative: Patient medically ready for discharge. HHRN and HH aide recommended. CSW made referrals to [facility] agencies, but patient was declined by all six.  [facility]: declined, does not accept Medicaid [facility]: declined for compliance issues [facility]: [place] office does not have availability for Medicaid patients at this time [facility]: declined, unable to accept due to number of wound care patients already receiving services [facility]: declined due to short staffing [facility]: declined  Patient is need of transportation home. CSW scheduled PTAR and completed medical necessity form. TOC signing off.  Final next level of care: Home/Self Care Barriers to Discharge: No Home Care Agency will accept this patient,Barriers Unresolved (comment) (Patient was referred to 6 home health agencies and was declined from all of them.)  Patient Goals and CMS Choice Patient states their goals for this hospitalization and ongoing recovery are:: Discharge home and schedule wound care appointment at [facility] in [facility] Choice offered to / list presented to : NA  Discharge Plan and Services In-house Referral: Clinical Social Work Post Acute Care Choice: NA          DME Arranged: N/A DME Agency: NA  Readmission Risk Interventions No flowsheet data found.

## 2020-10-01 NOTE — Discharge Summary (Signed)
Physician Discharge Summary  [name] [name] ZOX:[id number] DOB: 02-07-1991 DOA: 09/27/2020  PCP: Pcp, No  Admit date: 09/27/2020 Discharge date: 10/01/2020  Admitted From: Home Disposition: Home Recommendations for Outpatient Follow-up:  1. Follow up with PCP in 1-2 weeks 2. Please obtain BMP/CBC in one week 3. Please follow up with [facility] wound clinic  Home Health: Yes Equipment/Devices: None Discharge Condition stable CODE STATUS: Full code Diet recommendation: Cardiac Brief/Interim Summary:30 year old female with past medical history of chronic lymphedema, chronic bilateral lower extremity wounds, chronic pain syndrome came to the ED with complaints of worsening bilateral lower extremity pain.  Patient has chronic wounds on lower extremity and has been having pain, she noted more erythema, edema and small amount of drainage. Patient was started on IV antibiotics, vancomycin and cefepime  Discharge Diagnoses:  Active Problems:   Cellulitis   Chronic pain syndrome   Bilateral edema of lower extremity   Anemia   Obesity, Class III, BMI 40-49.9 (morbid obesity) (HCC)   Lymphedema   Acute on chronic bilateral lower extremity wound/ulcer-patient presented with worsening of the erythema and pain in the chronic lower extremity wounds.  She has history of lymphedema.  Patient empirically started on vancomycin and cefepime.  Patient was seen by wound care team.  She will be discharged on Augmentin and doxycycline.  She also have discharge orders to follow-up with [facility] wound clinic where she has been going.    Iron deficiency anemia-patient's hemoglobin on presentation was 7.6, serum iron 12, TIBC 324, saturation 4, ferritin 23.   Her hemoglobin dropped to 6.1 and was transfused with 2 units of packed RBC and she was asked to continue iron tablets at home.   Patient says that she has IUD in place for past 2 years.  So not having heavy periods.  Chronic bilateral lower extremity  edema/lymphedema-stable  Morbid obesity-BMI 45 kg/m  Chronic pain syndrome-continue home medication gabapentin and duloxetine.  I have given her a prescription for Oxy IR 30 tablets.     Estimated body mass index is 45.26 kg/m as calculated from the following:   Height as of this encounter:  (1.702 m).   Weight as of this encounter: 131.1 kg.  Discharge Instructions  Discharge Instructions    Call MD for:  persistant nausea and vomiting   Complete by: As directed    Call MD for:  severe uncontrolled pain   Complete by: As directed    Call MD for:  temperature >100.4   Complete by: As directed    Diet - low sodium heart healthy   Complete by: As directed    Discharge wound care:   Complete by: As directed    Cover all wounds with silver Hydrofiber Aquacel AG Place Lawson slightly dampened with sterile water added to dressings once in place with a large syringe.  Do not saturated do not use saline.  Cover with ABD pads secured with Kerlix and 4 inch Kerlix wrapped from toes to popliteal area.  Change every other day.   Increase activity slowly   Complete by: As directed      Allergies as of 10/01/2020      Reactions   Silver       Medication List    STOP taking these medications   HYDROcodone-acetaminophen 10-325 MG tablet Commonly known as: NORCO     TAKE these medications   amoxicillin-clavulanate 875-125 MG tablet Commonly known as: Augmentin Take 1 tablet by mouth 2 (two) times daily for 10 days.  doxycycline 50 MG capsule Commonly known as: VIBRAMYCIN Take 2 capsules (100 mg total) by mouth 2 (two) times daily.   DULoxetine 30 MG capsule Commonly known as: CYMBALTA Take 90 mg by mouth daily.   ferrous gluconate 324 MG tablet Commonly known as: FERGON Take 1 tablet (324 mg total) by mouth daily with breakfast. Start taking on: October 02, 2020   gabapentin 600 MG tablet Commonly known as: NEURONTIN Take 600 mg by mouth 3 (three) times daily.    ondansetron 4 MG tablet Commonly known as: ZOFRAN Take 1 tablet (4 mg total) by mouth every 6 (six) hours as needed for nausea.   Oxycodone HCl 10 MG Tabs Take 1 tablet (10 mg total) by mouth every 4 (four) hours as needed for moderate pain.   tiZANidine 4 MG tablet Commonly known as: ZANAFLEX Take 4 mg by mouth 3 (three) times daily as needed for muscle spasms.            Discharge Care Instructions  (From admission, onward)         Start     Ordered   10/01/20 0000  Discharge wound care:       Comments: Cover all wounds with silver Hydrofiber Aquacel AG Place Lawson slightly dampened with sterile water added to dressings once in place with a large syringe.  Do not saturated do not use saline.  Cover with ABD pads secured with Kerlix and 4 inch Kerlix wrapped from toes to popliteal area.  Change every other day.   10/01/20 1020          Allergies  Allergen Reactions  . Silver     Consultations:  None   Procedures/Studies:  No results found. (Echo, Carotid, EGD, Colonoscopy, ERCP)    Subjective: She is resting in bed complaining of leg pain no other new complaints and  Discharge Exam: Vitals:   09/30/20 2055 10/01/20 0510  BP: 124/70 (!) 108/57  Pulse: 65 (!) 59  Resp: 16 15  Temp: 98.7 F (37.1 C) 97.9 F (36.6 C)  SpO2: 100% 100%   Vitals:   09/30/20 0524 09/30/20 1330 09/30/20 2055 10/01/20 0510  BP: (!) 93/52 (!) 104/56 124/70 (!) 108/57  Pulse: (!) 53 69 65 (!) 59  Resp: 17 17 16 15   Temp: 97.7 F (36.5 C) 98.6 F (37 C) 98.7 F (37.1 C) 97.9 F (36.6 C)  TempSrc: Oral Oral Oral   SpO2: 100% 99% 100% 100%  Weight:      Height:        General: Pt is alert, awake, not in acute distress Cardiovascular: RRR, S1/S2 +, no rubs, no gallops Respiratory: CTA bilaterally, no wheezing, no rhonchi Abdominal: Soft, NT, ND, bowel sounds + Extremities: Bilateral lower extremity 3+ lymphedema covered with dressings posterior venous stasis  ulcers.  The results of significant diagnostics from this hospitalization (including imaging, microbiology, ancillary and laboratory) are listed below for reference.     Microbiology: Recent Results (from the past 240 hour(s))  Blood culture (routine x 2)     Status: None (Preliminary result)   Collection Time: 09/27/20  7:40 PM   Specimen: BLOOD  Result Value Ref Range Status   Specimen Description   Final    BLOOD BLOOD LEFT FOREARM Performed at [facility], [address] [place], [place] [id number]    Special Requests   Final    BOTTLES DRAWN AEROBIC AND ANAEROBIC Blood Culture adequate volume Performed at [facility], [address] [place], [place] [address]    Culture   Final    NO GROWTH 4 DAYS Performed at [facility], [address] [place], [place] [address]    Report Status PENDING  Incomplete  Blood culture (routine x 2)     Status: None (Preliminary result)   Collection Time: 09/27/20  7:40 PM   Specimen: BLOOD  Result Value Ref Range Status   Specimen Description   Final    BLOOD RIGHT ANTECUBITAL Performed at [facility], [address] [place], [place] [address]    Special Requests   Final    BOTTLES DRAWN AEROBIC AND ANAEROBIC Blood Culture results may not be optimal due to an inadequate volume of blood received in culture bottles   Culture   Final    NO GROWTH 4 DAYS Performed at [facility], [address] [place], [place] [address]    Report Status PENDING  Incomplete  Resp Panel by RT-PCR (Flu A&B, Covid) Nasopharyngeal Swab     Status: None   Collection Time: 09/27/20  9:22 PM   Specimen: Nasopharyngeal Swab; Nasopharyngeal(NP) swabs in vial transport medium  Result Value Ref Range Status   SARS Coronavirus 2 by RT PCR NEGATIVE NEGATIVE Final    Comment: (NOTE) SARS-CoV-2 target nucleic acids are NOT DETECTED.  The SARS-CoV-2 RNA is generally detectable in upper  respiratory specimens during the acute phase of infection. The lowest concentration of SARS-CoV-2 viral copies this assay can detect is 138 copies/mL. A negative result does not preclude SARS-Cov-2 infection and should not be used as the sole basis for treatment or other patient management decisions. A negative result may occur with  improper specimen collection/handling, submission of specimen other than nasopharyngeal swab, presence of viral mutation(s) within the areas targeted by this assay, and inadequate number of viral copies(<138 copies/mL). A negative result must be combined with clinical observations, patient history, and epidemiological information. The expected result is Negative.  Fact Sheet for Patients:  BloggerCourse.com  Fact Sheet for Healthcare Providers:  SeriousBroker.it  This test is no t yet approved or cleared by the [place] FDA and  has been authorized for detection and/or diagnosis of SARS-CoV-2 by FDA under an Emergency Use Authorization (EUA). This EUA will remain  in effect (meaning this test can be used) for the duration of the COVID-19 declaration under Section 564(b)(1) of the Act, 21 U.S.C.section 360bbb-3(b)(1), unless the authorization is terminated  or revoked sooner.       Influenza A by PCR NEGATIVE NEGATIVE Final   Influenza B by PCR NEGATIVE NEGATIVE Final    Comment: (NOTE) The Xpert Xpress SARS-CoV-2/FLU/RSV plus assay is intended as an aid in the diagnosis of influenza from Nasopharyngeal swab specimens and should not be used as a sole basis for treatment. Nasal washings and aspirates are unacceptable for Xpert Xpress SARS-CoV-2/FLU/RSV testing.  Fact Sheet for Patients: BloggerCourse.com  Fact Sheet for Healthcare Providers: SeriousBroker.it  This test is not yet approved or cleared by the [place] FDA and has been  authorized for detection and/or diagnosis of SARS-CoV-2 by FDA under an Emergency Use Authorization (EUA). This EUA will remain in effect (meaning this test can be used) for the duration of the COVID-19 declaration under Section 564(b)(1) of the Act, 21 U.S.C. section 360bbb-3(b)(1), unless the authorization is terminated or revoked.  Performed at [facility], [address], [place], [place] [address]      Labs: BNP (  last 3 results) No results for input(s): BNP in the last 8760 hours. Basic Metabolic Panel: Recent Labs  Lab 09/27/20 1940 09/30/20 0016  NA 135 132*  K 4.2 3.7  CL 104 102  CO2 24 21*  GLUCOSE 97 81  BUN 13 10  CREATININE 0.54 0.43*  CALCIUM 8.8* 8.3*   Liver Function Tests: Recent Labs  Lab 09/27/20 1940  AST 16  ALT 13  ALKPHOS 97  BILITOT 0.3  PROT 8.2*  ALBUMIN 2.7*   No results for input(s): LIPASE, AMYLASE in the last 168 hours. No results for input(s): AMMONIA in the last 168 hours. CBC: Recent Labs  Lab 09/27/20 1940 09/29/20 0431 09/30/20 0016  WBC 11.0* 13.1* 7.8  NEUTROABS 8.3*  --   --   HGB 7.6* 6.1* 9.1*  HCT 25.6* 20.5* 29.7*  MCV 72.3* 71.2* 74.6*  PLT 513* 436* 442*   Cardiac Enzymes: No results for input(s): CKTOTAL, CKMB, CKMBINDEX, TROPONINI in the last 168 hours. BNP: Invalid input(s): POCBNP CBG: Recent Labs  Lab 09/27/20 1826  GLUCAP 85   D-Dimer No results for input(s): DDIMER in the last 72 hours. Hgb A1c No results for input(s): HGBA1C in the last 72 hours. Lipid Profile No results for input(s): CHOL, HDL, LDLCALC, TRIG, CHOLHDL, LDLDIRECT in the last 72 hours. Thyroid function studies No results for input(s): TSH, T4TOTAL, T3FREE, THYROIDAB in the last 72 hours.  Invalid input(s): FREET3 Anemia work up No results for input(s): VITAMINB12, FOLATE, FERRITIN, TIBC, IRON, RETICCTPCT in the last 72 hours. Urinalysis No results found for: COLORURINE, APPEARANCEUR, LABSPEC, PHURINE,  GLUCOSEU, HGBUR, BILIRUBINUR, KETONESUR, PROTEINUR, UROBILINOGEN, NITRITE, LEUKOCYTESUR Sepsis Labs Invalid input(s): PROCALCITONIN,  WBC,  LACTICIDVEN Microbiology Recent Results (from the past 240 hour(s))  Blood culture (routine x 2)     Status: None (Preliminary result)   Collection Time: 09/27/20  7:40 PM   Specimen: BLOOD  Result Value Ref Range Status   Specimen Description   Final    BLOOD BLOOD LEFT FOREARM Performed at [facility], [address], [place], [place] [address]    Special Requests   Final    BOTTLES DRAWN AEROBIC AND ANAEROBIC Blood Culture adequate volume Performed at [facility], [address] [place], [place] [address]    Culture   Final    NO GROWTH 4 DAYS Performed at [facility], [address], [place], [place] [address]    Report Status PENDING  Incomplete  Blood culture (routine x 2)     Status: None (Preliminary result)   Collection Time: 09/27/20  7:40 PM   Specimen: BLOOD  Result Value Ref Range Status   Specimen Description   Final    BLOOD RIGHT ANTECUBITAL Performed at [facility], [address] [place], [place] [address]    Special Requests   Final    BOTTLES DRAWN AEROBIC AND ANAEROBIC Blood Culture results may not be optimal due to an inadequate volume of blood received in culture bottles   Culture   Final    NO GROWTH 4 DAYS Performed at [facility], [address] [place], [place] [address]    Report Status PENDING  Incomplete  Resp Panel by RT-PCR (Flu A&B, Covid) Nasopharyngeal Swab     Status: None   Collection Time: 09/27/20  9:22 PM   Specimen: Nasopharyngeal Swab; Nasopharyngeal(NP) swabs in vial transport medium  Result Value Ref Range Status   SARS Coronavirus 2 by RT  PCR NEGATIVE NEGATIVE Final    Comment: (NOTE) SARS-CoV-2 target nucleic acids are NOT DETECTED.  The SARS-CoV-2 RNA is generally detectable in upper respiratory specimens during  the acute phase of infection. The lowest concentration of SARS-CoV-2 viral copies this assay can detect is 138 copies/mL. A negative result does not preclude SARS-Cov-2 infection and should not be used as the sole basis for treatment or other patient management decisions. A negative result may occur with  improper specimen collection/handling, submission of specimen other than nasopharyngeal swab, presence of viral mutation(s) within the areas targeted by this assay, and inadequate number of viral copies(<138 copies/mL). A negative result must be combined with clinical observations, patient history, and epidemiological information. The expected result is Negative.  Fact Sheet for Patients:  BloggerCourse.com  Fact Sheet for Healthcare Providers:  SeriousBroker.it  This test is no t yet approved or cleared by the [place] FDA and  has been authorized for detection and/or diagnosis of SARS-CoV-2 by FDA under an Emergency Use Authorization (EUA). This EUA will remain  in effect (meaning this test can be used) for the duration of the COVID-19 declaration under Section 564(b)(1) of the Act, 21 U.S.C.section 360bbb-3(b)(1), unless the authorization is terminated  or revoked sooner.       Influenza A by PCR NEGATIVE NEGATIVE Final   Influenza B by PCR NEGATIVE NEGATIVE Final    Comment: (NOTE) The Xpert Xpress SARS-CoV-2/FLU/RSV plus assay is intended as an aid in the diagnosis of influenza from Nasopharyngeal swab specimens and should not be used as a sole basis for treatment. Nasal washings and aspirates are unacceptable for Xpert Xpress SARS-CoV-2/FLU/RSV testing.  Fact Sheet for Patients: BloggerCourse.com  Fact Sheet for Healthcare Providers: SeriousBroker.it  This test is not yet approved or cleared by the [place] FDA and has been authorized for detection and/or  diagnosis of SARS-CoV-2 by FDA under an Emergency Use Authorization (EUA). This EUA will remain in effect (meaning this test can be used) for the duration of the COVID-19 declaration under Section 564(b)(1) of the Act, 21 U.S.C. section 360bbb-3(b)(1), unless the authorization is terminated or revoked.  Performed at [facility], [address] [place], [place] [address]      Time coordinating discharge: 39 minutes  SIGNED:   Alwyn Ren, MD  [facility] 10/01/2020, 10:23 AM

## 2020-10-01 NOTE — Progress Notes (Signed)
Discharge paperwork given to pt. Went over packet with pt. All questions answered. Pt verbalized understanding. Pt currently leaving floor via PTAR.

## 2020-10-02 LAB — CULTURE, BLOOD (ROUTINE X 2)
Culture: NO GROWTH
Culture: NO GROWTH
Special Requests: ADEQUATE

## 2020-10-07 ENCOUNTER — Emergency Department (HOSPITAL_BASED_OUTPATIENT_CLINIC_OR_DEPARTMENT_OTHER)
Admission: EM | Admit: 2020-10-07 | Discharge: 2020-10-07 | Disposition: A | Discharge status: Home / Self Care | Payer: Medicaid Other | Attending: Emergency Medicine | Admitting: Emergency Medicine

## 2020-10-07 ENCOUNTER — Other Ambulatory Visit: Payer: Self-pay

## 2020-10-07 ENCOUNTER — Encounter (HOSPITAL_BASED_OUTPATIENT_CLINIC_OR_DEPARTMENT_OTHER): Payer: Self-pay

## 2020-10-07 DIAGNOSIS — W182XXA Fall in (into) shower or empty bathtub, initial encounter: Secondary | ICD-10-CM | POA: Diagnosis not present

## 2020-10-07 DIAGNOSIS — E119 Type 2 diabetes mellitus without complications: Secondary | ICD-10-CM | POA: Diagnosis not present

## 2020-10-07 DIAGNOSIS — G8929 Other chronic pain: Secondary | ICD-10-CM | POA: Insufficient documentation

## 2020-10-07 DIAGNOSIS — M79605 Pain in left leg: Secondary | ICD-10-CM | POA: Diagnosis present

## 2020-10-07 DIAGNOSIS — D72829 Elevated white blood cell count, unspecified: Secondary | ICD-10-CM | POA: Insufficient documentation

## 2020-10-07 DIAGNOSIS — M79604 Pain in right leg: Secondary | ICD-10-CM | POA: Insufficient documentation

## 2020-10-07 DIAGNOSIS — R000 Tachycardia, unspecified: Secondary | ICD-10-CM | POA: Diagnosis not present

## 2020-10-07 LAB — BASIC METABOLIC PANEL
Anion gap: 11 (ref 5–15)
BUN: 12 mg/dL (ref 6–20)
CO2: 25 mmol/L (ref 22–32)
Calcium: 9 mg/dL (ref 8.9–10.3)
Chloride: 97 mmol/L — ABNORMAL LOW (ref 98–111)
Creatinine, Ser: 0.6 mg/dL (ref 0.44–1.00)
GFR, Estimated: >60 mL/min (ref >60)
Glucose, Bld: 96 mg/dL (ref 70–99)
Potassium: 3.4 mmol/L — ABNORMAL LOW (ref 3.5–5.1)
Sodium: 133 mmol/L — ABNORMAL LOW (ref 135–145)

## 2020-10-07 LAB — CBC WITH DIFFERENTIAL/PLATELET
Abs Immature Granulocytes: 0.04 10*3/uL (ref 0.00–0.07)
Basophils Absolute: 0 10*3/uL (ref 0.0–0.1)
Basophils Relative: 0 %
Eosinophils Absolute: 0.1 10*3/uL (ref 0.0–0.5)
Eosinophils Relative: 1 %
HCT: 35.2 % — ABNORMAL LOW (ref 36.0–46.0)
Hemoglobin: 10.5 g/dL — ABNORMAL LOW (ref 12.0–15.0)
Immature Granulocytes: 0 %
Lymphocytes Relative: 16 %
Lymphs Abs: 1.7 10*3/uL (ref 0.7–4.0)
MCH: 22.5 pg — ABNORMAL LOW (ref 26.0–34.0)
MCHC: 29.8 g/dL — ABNORMAL LOW (ref 30.0–36.0)
MCV: 75.4 fL — ABNORMAL LOW (ref 80.0–100.0)
Monocytes Absolute: 0.7 10*3/uL (ref 0.1–1.0)
Monocytes Relative: 6 %
Neutro Abs: 8.4 10*3/uL — ABNORMAL HIGH (ref 1.7–7.7)
Neutrophils Relative %: 77 %
Platelets: 565 10*3/uL — ABNORMAL HIGH (ref 150–400)
RBC: 4.67 MIL/uL (ref 3.87–5.11)
RDW: 21 % — ABNORMAL HIGH (ref 11.5–15.5)
WBC: 11.1 10*3/uL — ABNORMAL HIGH (ref 4.0–10.5)
nRBC: 0 % (ref 0.0–0.2)

## 2020-10-07 LAB — HCG, QUANTITATIVE, PREGNANCY: hCG, Beta Chain, Quant, S: <1 m[IU]/mL (ref <5)

## 2020-10-07 LAB — LACTIC ACID, PLASMA: Lactic Acid, Venous: 1 mmol/L (ref 0.5–1.9)

## 2020-10-07 MED ORDER — HYDROMORPHONE HCL 1 MG/ML IJ SOLN
1.0000 mg | Freq: Once | INTRAMUSCULAR | Status: AC
Start: 2020-10-07 — End: 2020-10-07
  Administered 2020-10-07: 1 mg via INTRAVENOUS
  Filled 2020-10-07: qty 1

## 2020-10-07 MED ORDER — HYDROMORPHONE HCL 1 MG/ML IJ SOLN
1.0000 mg | Freq: Once | INTRAMUSCULAR | Status: AC
  Administered 2020-10-07: 1 mg via INTRAVENOUS
  Filled 2020-10-07: qty 1

## 2020-10-07 MED ORDER — ONDANSETRON HCL 4 MG/2ML IJ SOLN
4.0000 mg | Freq: Once | INTRAMUSCULAR | Status: AC
  Administered 2020-10-07: 4 mg via INTRAVENOUS
  Filled 2020-10-07: qty 2

## 2020-10-07 NOTE — ED Notes (Addendum)
Pt c/o chronic leg/wound pain - goes to wound clinic monthly States that she is currently taking  two ABX for wounds Out of Oxycodone needs more pain meds until she can see pain management  I asked if any tylenol or ibuprofen was taken today for the pain - pt replied "does that work?"  Pt c/o that she has been waiting 45 minutes and is moaning and crying in which the majority of the dept can hear pt. I advised pt that the PA or MD will be with her as soon as they can    Wound to bilat back of legs. Lymphedema obvious to bilateral legs - ankles are not visible

## 2020-10-07 NOTE — ED Notes (Signed)
Pt refuses to get into the bed, pt states it puts pressure on my legs and I cannot tolerate it.  Pt sitting in chair between wall and bed with table in front of her.  VS taken.

## 2020-10-07 NOTE — ED Notes (Signed)
Pt refused to get on gurney for IV start

## 2020-10-07 NOTE — ED Triage Notes (Addendum)
Pt transported by [facility]/ via w/c to ED-[facility] report pt with chronic bilat LE pain/swelling with recent admn to [facility] for same-pt crying/moaning/yelling-unable to complete traieg ?s due to pt's behavior-[name] [name] charge RN advised to place pt stretcher hall #13
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
## 2020-10-07 NOTE — Discharge Instructions (Addendum)
You were seen in the ER for your leg pain. Your blood work and physical exam was reassuring, there does not appear to be any worsening of your infections in your legs.  You administered pain medication in the emergency department, however we are unable to prescribe pain medication for you to take at home given your recently prescribed pain medication outpatient.  Please contact your pain management providers tomorrow morning for further pain medication prescriptions.  Additionally please call your wound care facility for reevaluation.  Return to the ER for develop any new severe symptoms.

## 2020-10-09 ENCOUNTER — Encounter (HOSPITAL_COMMUNITY): Payer: Self-pay

## 2020-10-09 ENCOUNTER — Emergency Department (HOSPITAL_COMMUNITY)
Admission: EM | Admit: 2020-10-09 | Discharge: 2020-10-09 | Disposition: A | Discharge status: Home / Self Care | Payer: Medicaid Other | Attending: Emergency Medicine | Admitting: Emergency Medicine

## 2020-10-09 ENCOUNTER — Other Ambulatory Visit: Payer: Self-pay

## 2020-10-09 DIAGNOSIS — M79661 Pain in right lower leg: Secondary | ICD-10-CM | POA: Diagnosis present

## 2020-10-09 DIAGNOSIS — I890 Lymphedema, not elsewhere classified: Secondary | ICD-10-CM | POA: Insufficient documentation

## 2020-10-09 DIAGNOSIS — G8929 Other chronic pain: Secondary | ICD-10-CM | POA: Diagnosis not present

## 2020-10-09 DIAGNOSIS — E119 Type 2 diabetes mellitus without complications: Secondary | ICD-10-CM | POA: Diagnosis not present

## 2020-10-09 DIAGNOSIS — M79662 Pain in left lower leg: Secondary | ICD-10-CM | POA: Diagnosis not present

## 2020-10-09 DIAGNOSIS — M79605 Pain in left leg: Secondary | ICD-10-CM

## 2020-10-09 LAB — CBC WITH DIFFERENTIAL/PLATELET
Abs Immature Granulocytes: 0.03 10*3/uL (ref 0.00–0.07)
Basophils Absolute: 0 10*3/uL (ref 0.0–0.1)
Basophils Relative: 0 %
Eosinophils Absolute: 0.2 10*3/uL (ref 0.0–0.5)
Eosinophils Relative: 2 %
HCT: 30.4 % — ABNORMAL LOW (ref 36.0–46.0)
Hemoglobin: 9.1 g/dL — ABNORMAL LOW (ref 12.0–15.0)
Immature Granulocytes: 0 %
Lymphocytes Relative: 20 %
Lymphs Abs: 1.9 10*3/uL (ref 0.7–4.0)
MCH: 23 pg — ABNORMAL LOW (ref 26.0–34.0)
MCHC: 29.9 g/dL — ABNORMAL LOW (ref 30.0–36.0)
MCV: 76.8 fL — ABNORMAL LOW (ref 80.0–100.0)
Monocytes Absolute: 0.8 10*3/uL (ref 0.1–1.0)
Monocytes Relative: 8 %
Neutro Abs: 6.7 10*3/uL (ref 1.7–7.7)
Neutrophils Relative %: 70 %
Platelets: 479 10*3/uL — ABNORMAL HIGH (ref 150–400)
RBC: 3.96 MIL/uL (ref 3.87–5.11)
RDW: 20.3 % — ABNORMAL HIGH (ref 11.5–15.5)
WBC: 9.6 10*3/uL (ref 4.0–10.5)
nRBC: 0 % (ref 0.0–0.2)

## 2020-10-09 LAB — BASIC METABOLIC PANEL
Anion gap: 7 (ref 5–15)
BUN: 21 mg/dL — ABNORMAL HIGH (ref 6–20)
CO2: 26 mmol/L (ref 22–32)
Calcium: 8.9 mg/dL (ref 8.9–10.3)
Chloride: 106 mmol/L (ref 98–111)
Creatinine, Ser: 0.75 mg/dL (ref 0.44–1.00)
GFR, Estimated: >60 mL/min (ref >60)
Glucose, Bld: 96 mg/dL (ref 70–99)
Potassium: 4 mmol/L (ref 3.5–5.1)
Sodium: 139 mmol/L (ref 135–145)

## 2020-10-09 LAB — HCG, SERUM, QUALITATIVE: Preg, Serum: NEGATIVE

## 2020-10-09 MED ORDER — HYDROMORPHONE HCL 1 MG/ML IJ SOLN
1.0000 mg | Freq: Once | INTRAMUSCULAR | Status: AC
Start: 2020-10-09 — End: 2020-10-09
  Administered 2020-10-09: 1 mg via INTRAVENOUS
  Filled 2020-10-09: qty 1

## 2020-10-09 MED ORDER — OXYCODONE-ACETAMINOPHEN 5-325 MG PO TABS
2.0000 | ORAL_TABLET | Freq: Once | ORAL | Status: AC
  Administered 2020-10-09: 2 via ORAL
  Filled 2020-10-09: qty 2

## 2020-10-09 MED ORDER — HYDROMORPHONE HCL 1 MG/ML IJ SOLN
1.0000 mg | Freq: Once | INTRAMUSCULAR | Status: AC
  Administered 2020-10-09: 1 mg via INTRAVENOUS
  Filled 2020-10-09: qty 1
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
## 2020-10-09 NOTE — ED Notes (Signed)
Pt request to stay in wheelchair stat she is unable to stand as well as lay down in bed

## 2020-10-09 NOTE — Discharge Instructions (Addendum)
You are seen in the emergency department today due to pain to your lower legs.  You were given pain medication in the emergency department.  Your labs appeared similar to prior blood work you have had done.  You need to follow-up with your pain management doctor for further pain medicine.  We have also provided a local pain management doctor.  Please follow-up with the wound clinic as planned.  Call both your pain management doctor and your wound care doctor to make them aware of your ED visit today to schedule follow-up as soon as possible.  Return to the ER for new or worsening symptoms including but not limited to fever, increased redness/swelling/pain, pus draining from wounds, or any other concerns.

## 2020-10-09 NOTE — ED Notes (Signed)
[facility] called for patient 

## 2020-10-09 NOTE — ED Notes (Signed)
Patient given diet ginger ale. Dr. Bebe Shaggy is okay with patient having food/drink.

## 2020-10-09 NOTE — ED Provider Notes (Signed)
[facility]-EMERGENCY DEPT Provider Note   CSN: [id number] Arrival date & time: 10/09/20  0018     History Chief Complaint  Patient presents with  . Leg Pain  . Wound Check    [name] [name] is a 30 y.o. female with a history of diabetes mellitus, chronic pain syndrome, lymphedema, and chronic bilateral lower extremity wounds who returns to the emergency department with complaints of continued bilateral lower extremity pain.  Patient states that she has chronic problems with ulcers, pain, and swelling to her bilateral lower extremities.  She ran out of her pain medication states the pain is now unbearable, otherwise lower extremities are similar to prior..  No alleviating or aggravating factors.  No intervention prior to arrival.  No recent injury or change in activity.  She continues to take Augmentin and doxycycline.  Reports subjective fever last night.  Denies chest pain, abdominal pain, dyspnea, syncope, or change in typical mild drainage from her ulcers.  Chart review for additional history: Patient with recent hospital admission 417/22 through 10/01/2020 for acute on chronic bilateral lower extremity wounds/ulcers and cellulitis.  She was empirically started on vancomycin and cefepime, seen by wound care team, discharged home on Augmentin and doxycycline with plans to follow-up with [facility] wound clinic where she typically goes.  For her chronic pain syndrome she is on gabapentin and duloxetine, she was given a prescription for 30 tablets of oxycodone IR at time of discharge 10/01/2020, also had received a prescription for 120 tablets of Norco (hydrocodone 10 mgs) on 09/15/20.  She was subsequently seen in our emergency department 10/07/2020 due to continued pain to her wounds.   HPI     Past Medical History:  Diagnosis Date  . DM (diabetes mellitus) (HCC)   . S/P debridement     Patient Active Problem List   Diagnosis Date Noted  . Lymphedema 09/28/2020  .  Cellulitis 09/27/2020  . Chronic pain syndrome 09/27/2020  . Bilateral edema of lower extremity 09/27/2020  . Anemia 09/27/2020  . Obesity, Class III, BMI 40-49.9 (morbid obesity) (HCC) 09/27/2020    History reviewed. No pertinent surgical history.   OB History   No obstetric history on file.     History reviewed. No pertinent family history.  Social History   Tobacco Use  . Smoking status: Never Smoker  . Smokeless tobacco: Never Used    Home Medications Prior to Admission medications   Medication Sig Start Date End Date Taking? Authorizing Provider  amoxicillin-clavulanate (AUGMENTIN) 875-125 MG tablet Take 1 tablet by mouth 2 (two) times daily for 10 days. 10/01/20 10/11/20  Alwyn Ren, MD  doxycycline (VIBRAMYCIN) 50 MG capsule Take 2 capsules (100 mg total) by mouth 2 (two) times daily. 10/01/20   Alwyn Ren, MD  DULoxetine (CYMBALTA) 30 MG capsule Take 90 mg by mouth daily. 08/11/20   [provider]  ferrous gluconate (FERGON) 324 MG tablet Take 1 tablet (324 mg total) by mouth daily with breakfast. 10/02/20   Alwyn Ren, MD  gabapentin (NEURONTIN) 600 MG tablet Take 600 mg by mouth 3 (three) times daily. 09/15/20   [provider]  ondansetron (ZOFRAN) 4 MG tablet Take 1 tablet (4 mg total) by mouth every 6 (six) hours as needed for nausea. 10/01/20   Alwyn Ren, MD  oxyCODONE 10 MG TABS Take 1 tablet (10 mg total) by mouth every 4 (four) hours as needed for moderate pain. 10/01/20   Alwyn Ren, MD  polyethylene glycol (MIRALAX / GLYCOLAX) 17 g packet Take 17 g by mouth daily. 10/01/20   Alwyn Ren, MD  tiZANidine (ZANAFLEX) 4 MG tablet Take 4 mg by mouth 3 (three) times daily as needed for muscle spasms. 09/05/20   [provider]  vitamin C (ASCORBIC ACID) 250 MG tablet Take 2 tablets (500 mg total) by mouth 2 (two) times daily. 10/01/20   Alwyn Ren, MD    Allergies    Silver  Review  of Systems   Review of Systems  Constitutional: Positive for fever (Subjective).  Respiratory: Negative for shortness of breath.   Cardiovascular: Positive for leg swelling. Negative for chest pain.  Gastrointestinal: Negative for abdominal pain.  Musculoskeletal: Positive for myalgias.  Skin: Positive for wound.  All other systems reviewed and are negative.   Physical Exam Updated Vital Signs BP (!) 132/91 (BP Location: Right Arm)   Pulse 99   Temp 97.8 F (36.6 C) (Oral)   Resp (!) 22   SpO2 100%   Physical Exam Vitals and nursing note reviewed.  Constitutional:      General: She is in acute distress (Patient is tearful.).  HENT:     Head: Normocephalic and atraumatic.  Cardiovascular:     Rate and Rhythm: Normal rate and regular rhythm.     Comments: Patient has 2+ palpable DP pulses as well as easily dopplerable DP and PT pulses which are symmetric. Pulmonary:     Effort: Pulmonary effort is normal.     Breath sounds: Normal breath sounds.  Abdominal:     General: There is no distension.     Palpations: Abdomen is soft.     Tenderness: There is no abdominal tenderness.  Musculoskeletal:     Cervical back: Neck supple.     Comments: Lower extremities: Patient has findings consistent with lymphedema.  She has multiple very large ulcerations as pictured below.  Pictures are limited as patient wanted to remain in chair as opposed to sitting on the stretcher.  There is no significant increased erythema or warmth.  There is no purulent drainage.  Mild tenderness to palpation to the lower legs.  No focal bony tenderness to palpation.  Skin:    General: Skin is warm and dry.  Neurological:     Mental Status: She is alert.     Comments: Sensation grossly intact bilateral lower extremities.  Able to plantar and dorsiflex against resistance         ED Results / Procedures / Treatments   Labs (all labs ordered are listed, but only abnormal results are displayed) Labs  Reviewed  BASIC METABOLIC PANEL - Abnormal; Notable for the following components:      Result Value   BUN 21 (*)    All other components within normal limits  CBC WITH DIFFERENTIAL/PLATELET - Abnormal; Notable for the following components:   Hemoglobin 9.1 (*)    HCT 30.4 (*)    MCV 76.8 (*)    MCH 23.0 (*)    MCHC 29.9 (*)    RDW 20.3 (*)    Platelets 479 (*)    All other components within normal limits  HCG, SERUM, QUALITATIVE    EKG None  Radiology No results found.  Procedures Procedures   Medications Ordered in ED Medications - No data to display  ED Course  I have reviewed the triage vital signs and the nursing notes.  Pertinent labs & imaging results that were available during my care of the  patient were reviewed by me and considered in my medical decision making (see chart for details).    MDM Rules/Calculators/A&P                         Patient presents to the ED with complaints of acute on chronic pain to her bilateral lower legs/ulcers.  She is nontoxic, mildly tearful, vitals without significant abnormality.  Additional history obtained:  Additional history obtained from chart review & nursing note review.  Recent ED visit for same.  Also had an admission and received IV antibiotics.  Currently on doxycycline and Augmentin.  Lab Tests:  I Ordered, reviewed, and interpreted labs, which included:  CBC: No leukocytosis, anemia similar to prior ranges. BMP: Fairly unremarkable Pregnancy test: Negative  ED Course:  No recent acute trauma or focal bony tenderness to raise concern for fracture or dislocation.  Patient is neurovascularly intact distally.  Exam appears similar to what is pictured from her visit 2 days prior.  Patient did report subjective fever, she is afebrile in the emergency department, has no significant increased erythema/warmth/purulent drainage to raise concern for need for admission for IV antibiotics, continue her Augmentin and  doxycycline and follow-up with wound clinic.  She was given a dose of oral and subsequently IV analgesics in the emergency department with improvement in her pain.  I explained to the patient that she would need to follow-up with her pain management doctor for further pain management in terms of narcotics. I discussed results, treatment plan, need for follow-up, and return precautions with the patient. Provided opportunity for questions, patient confirmed understanding and is in agreement with plan.   Findings and plan of care discussed with supervising physician Dr. Bebe Shaggy who has evaluated the patient & is in agreement.   Blood pressure 112/77, pulse 93, temperature 97.8 F (36.6 C), temperature source Oral, resp. rate 15, SpO2 100 %.  Portions of this note were generated with Scientist, clinical (histocompatibility and immunogenetics). Dictation errors may occur despite best attempts at proofreading.  Final Clinical Impression(s) / ED Diagnoses Final diagnoses:  Chronic pain of both lower extremities    Rx / DC Orders ED Discharge Orders    None       Cherly Anderson, PA-C 10/09/20 0526    Zadie Rhine, MD 10/09/20 [phone]

## 2020-10-09 NOTE — ED Notes (Signed)
Notified Samantha, PA about patients pain 10/10, despite medication administration.

## 2020-10-11 ENCOUNTER — Emergency Department (HOSPITAL_COMMUNITY)
Admission: EM | Admit: 2020-10-11 | Discharge: 2020-10-11 | Disposition: A | Discharge status: Home / Self Care | Payer: Medicaid Other | Source: Home / Self Care | Attending: Emergency Medicine | Admitting: Emergency Medicine

## 2020-10-11 ENCOUNTER — Emergency Department (HOSPITAL_BASED_OUTPATIENT_CLINIC_OR_DEPARTMENT_OTHER)
Admission: EM | Admit: 2020-10-11 | Discharge: 2020-10-11 | Disposition: A | Discharge status: Home / Self Care | Payer: Medicaid Other | Attending: Emergency Medicine | Admitting: Emergency Medicine

## 2020-10-11 ENCOUNTER — Encounter (HOSPITAL_COMMUNITY): Payer: Self-pay | Admitting: Emergency Medicine

## 2020-10-11 ENCOUNTER — Encounter (HOSPITAL_BASED_OUTPATIENT_CLINIC_OR_DEPARTMENT_OTHER): Payer: Self-pay | Admitting: Emergency Medicine

## 2020-10-11 ENCOUNTER — Encounter (HOSPITAL_BASED_OUTPATIENT_CLINIC_OR_DEPARTMENT_OTHER): Payer: Self-pay

## 2020-10-11 ENCOUNTER — Other Ambulatory Visit: Payer: Self-pay

## 2020-10-11 ENCOUNTER — Emergency Department (HOSPITAL_BASED_OUTPATIENT_CLINIC_OR_DEPARTMENT_OTHER)
Admission: EM | Admit: 2020-10-11 | Discharge: 2020-10-11 | Disposition: A | Discharge status: Home / Self Care | Payer: Medicaid Other | Source: Home / Self Care

## 2020-10-11 DIAGNOSIS — M79604 Pain in right leg: Secondary | ICD-10-CM | POA: Insufficient documentation

## 2020-10-11 DIAGNOSIS — G8929 Other chronic pain: Secondary | ICD-10-CM | POA: Diagnosis not present

## 2020-10-11 DIAGNOSIS — M79662 Pain in left lower leg: Secondary | ICD-10-CM | POA: Diagnosis not present

## 2020-10-11 DIAGNOSIS — L97909 Non-pressure chronic ulcer of unspecified part of unspecified lower leg with unspecified severity: Secondary | ICD-10-CM | POA: Insufficient documentation

## 2020-10-11 DIAGNOSIS — E119 Type 2 diabetes mellitus without complications: Secondary | ICD-10-CM | POA: Insufficient documentation

## 2020-10-11 DIAGNOSIS — M79661 Pain in right lower leg: Secondary | ICD-10-CM | POA: Insufficient documentation

## 2020-10-11 DIAGNOSIS — I890 Lymphedema, not elsewhere classified: Secondary | ICD-10-CM | POA: Insufficient documentation

## 2020-10-11 DIAGNOSIS — Z5321 Procedure and treatment not carried out due to patient leaving prior to being seen by health care provider: Secondary | ICD-10-CM | POA: Insufficient documentation

## 2020-10-11 DIAGNOSIS — I872 Venous insufficiency (chronic) (peripheral): Secondary | ICD-10-CM

## 2020-10-11 DIAGNOSIS — M79605 Pain in left leg: Secondary | ICD-10-CM | POA: Insufficient documentation

## 2020-10-11 MED ORDER — OXYCODONE HCL 5 MG PO CAPS: 10.0000 mg | ORAL_CAPSULE | Freq: Two times a day (BID) | ORAL | 0 refills | Status: AC | PRN

## 2020-10-11 MED ORDER — LIDOCAINE 5 % EX PTCH: 1.0000 | MEDICATED_PATCH | CUTANEOUS | 0 refills | Status: DC

## 2020-10-11 MED ORDER — HYDROMORPHONE HCL 2 MG/ML IJ SOLN
2.0000 mg | Freq: Once | INTRAMUSCULAR | Status: AC
  Administered 2020-10-11: 2 mg via INTRAMUSCULAR
  Filled 2020-10-11: qty 1

## 2020-10-11 MED ORDER — OXYCODONE HCL 5 MG PO TABS
10.0000 mg | ORAL_TABLET | Freq: Once | ORAL | Status: AC
  Administered 2020-10-11: 10 mg via ORAL
  Filled 2020-10-11: qty 2

## 2020-10-11 MED ORDER — LIDOCAINE HCL URETHRAL/MUCOSAL 2 % EX GEL
1.0000 "application " | Freq: Once | CUTANEOUS | Status: AC
  Administered 2020-10-11: 1
  Filled 2020-10-11: qty 11

## 2020-10-11 MED ORDER — KETOROLAC TROMETHAMINE 60 MG/2ML IM SOLN
60.0000 mg | Freq: Once | INTRAMUSCULAR | Status: AC
  Administered 2020-10-11: 60 mg via INTRAMUSCULAR
  Filled 2020-10-11: qty 2

## 2020-10-11 MED ORDER — LIDOCAINE HCL URETHRAL/MUCOSAL 2 % EX GEL
1.0000 "application " | Freq: Once | CUTANEOUS | Status: DC
  Filled 2020-10-11: qty 11

## 2020-10-11 MED ORDER — OXYCODONE-ACETAMINOPHEN 5-325 MG PO TABS
1.0000 | ORAL_TABLET | Freq: Once | ORAL | Status: AC
  Administered 2020-10-11: 1 via ORAL
  Filled 2020-10-11: qty 1

## 2020-10-11 NOTE — ED Notes (Signed)
Pt states her ride is on the way and will not wait.  Pt would like supplies to clean wounds to BLE and leave.  Did take home meds (muscle relaxer and ibuprofen) prior to DC.

## 2020-10-11 NOTE — ED Triage Notes (Signed)
Arrives via EMS from home with c/o chronic pain tto wounds. Out of hydrocodone and oxycodone, PCP declines to give more

## 2020-10-11 NOTE — ED Notes (Signed)
bil leg dressing applied, crying in pain, pain med given [facility] called

## 2020-10-11 NOTE — ED Provider Notes (Signed)
[facility]-EMERGENCY DEPT Provider Note   CSN: [id number] Arrival date & time: 10/11/20  1832     History No chief complaint on file.   [name] [name] is a 30 y.o. female.  HPI    30 year old female comes in a chief complaint of leg pain.  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  Patient reports that she started having severe pain in her leg because of her ulcers earlier today.  She was admitted to the hospital few days back and discharged with antibiotics and pain meds. No specific evoking, aggravating or relieving factor besides a fall that occurred yesterday.  Review of system is negative for any fevers, chills.  Past Medical History:  Diagnosis Date  . DM (diabetes mellitus) (HCC)   . S/P debridement     Patient Active Problem List   Diagnosis Date Noted  . Lymphedema 09/28/2020  . Cellulitis 09/27/2020  . Chronic pain syndrome 09/27/2020  . Bilateral edema of lower extremity 09/27/2020  . Anemia 09/27/2020  . Obesity, Class III, BMI 40-49.9 (morbid obesity) (HCC) 09/27/2020    History reviewed. No pertinent surgical history.   OB History   No obstetric history on file.     No family history on file.  Social History   Tobacco Use  . Smoking status: Never Smoker  . Smokeless tobacco: Never Used  Substance Use Topics  . Alcohol use: Not Currently    Home Medications Prior to Admission medications   Medication Sig Start Date End Date Taking? Authorizing Provider  oxycodone (OXY-IR) 5 MG capsule Take 2 capsules (10 mg total) by mouth every 12 (twelve) hours as needed for up to 2 days. 10/11/20 10/13/20 Yes Derwood Kaplan, MD  amoxicillin-clavulanate (AUGMENTIN) 875-125 MG tablet Take 1 tablet by mouth 2 (two) times daily for 10 days. 10/01/20 10/11/20  Alwyn Ren, MD  doxycycline (VIBRAMYCIN) 50 MG capsule Take 2 capsules (100 mg total) by mouth 2 (two) times daily. 10/01/20    Alwyn Ren, MD  DULoxetine (CYMBALTA) 30 MG capsule Take 90 mg by mouth daily. 08/11/20   [provider]  ferrous gluconate (FERGON) 324 MG tablet Take 1 tablet (324 mg total) by mouth daily with breakfast. 10/02/20   Alwyn Ren, MD  gabapentin (NEURONTIN) 600 MG tablet Take 600 mg by mouth 3 (three) times daily. 09/15/20   [provider]  ibuprofen (ADVIL) 200 MG tablet Take 400 mg by mouth every 6 (six) hours as needed for headache, mild pain or moderate pain.    [provider]  lidocaine (LIDODERM) 5 % Place 1 patch onto the skin daily. Remove & Discard patch within 12 hours or as directed by MD 10/11/20   Nicanor Alcon, April, MD  ondansetron (ZOFRAN) 4 MG tablet Take 1 tablet (4 mg total) by mouth every 6 (six) hours as needed for nausea. 10/01/20   Alwyn Ren, MD  polyethylene glycol (MIRALAX / GLYCOLAX) 17 g packet Take 17 g by mouth daily. 10/01/20   Alwyn Ren, MD  tiZANidine (ZANAFLEX) 4 MG tablet Take 4 mg by mouth 3 (three) times daily as needed for muscle spasms. 09/05/20   [provider]  vitamin C (ASCORBIC ACID) 250 MG tablet Take 2 tablets (500 mg total) by mouth 2 (two) times daily. Patient not taking: No sig reported 10/01/20   Alwyn Ren, MD    Allergies    Silver  Review of Systems  Review of Systems  Constitutional: Positive for activity change.  Skin: Positive for wound.  Allergic/Immunologic: Negative for immunocompromised state.  Hematological: Does not bruise/bleed easily.  All other systems reviewed and are negative.   Physical Exam Updated Vital Signs BP 133/76   Pulse (!) 120   Temp 98.2 F (36.8 C) (Oral)   Resp (!) 24   SpO2 100%   Physical Exam Vitals and nursing note reviewed.  Constitutional:      Appearance: She is well-developed.  HENT:     Head: Normocephalic and atraumatic.  Cardiovascular:     Rate and Rhythm: Normal rate.  Pulmonary:     Effort: Pulmonary effort  is normal.  Abdominal:     General: Bowel sounds are normal.  Musculoskeletal:     Cervical back: Normal range of motion and neck supple.  Skin:    General: Skin is warm and dry.     Comments: See the pictures below  Neurological:     Mental Status: She is alert and oriented to person, place, and time.           ED Results / Procedures / Treatments   Labs (all labs ordered are listed, but only abnormal results are displayed) Labs Reviewed - No data to display  EKG None  Radiology No results found.  Procedures Procedures   Medications Ordered in ED Medications  lidocaine (XYLOCAINE) 2 % jelly 1 application (1 application Topical Patient Refused/Not Given 10/11/20 2109)  oxyCODONE (Oxy IR/ROXICODONE) immediate release tablet 10 mg (has no administration in time range)  HYDROmorphone (DILAUDID) injection 2 mg (2 mg Intramuscular Given 10/11/20 2107)    ED Course  I have reviewed the triage vital signs and the nursing notes.  Pertinent labs & imaging results that were available during my care of the patient were reviewed by me and considered in my medical decision making (see chart for details).    MDM Rules/Calculators/A&P                          30 year old female with history of diabetes and lymphedema leading to large chronic ulcers comes in with chief complaint of pain over the ulcer site.  She was recently admitted to the hospital and given antibiotics.  Currently the lesions do not appear to be infected.  There is no evidence of necrosis.  Patient has been seen in the ER on 2 or 3 separate occasions after being discharged from the hospital last week because of her pain.  Narcotic database reveals that patient was discharged with oxycodone 10 mg IR, she reports that she is out of that medication and that it was working better than the hydrocodone that was prescribed to her by her pain doctor.  She has a pain specialist follow-up coming up on Tuesday.  I have  decided to give her 2 mg IM hydromorphone now and advised her to follow-up with her pain specialist.  We will give her 30 mg of IR oxycodone to get her into Tuesday appointment.  Final Clinical Impression(s) / ED Diagnoses Final diagnoses:  Venous stasis ulcer of ankle with other ulcer severity without varicose veins, unspecified laterality (HCC)    Rx / DC Orders ED Discharge Orders         Ordered    oxycodone (OXY-IR) 5 MG capsule  Every 12 hours PRN        10/11/20 2141  Derwood Kaplan, MD 10/11/20 2218

## 2020-10-11 NOTE — ED Provider Notes (Signed)
This patient left without being seen.  I did not have a chance to evaluate her for complaints.  However she was seen earlier today.  Feels that she is likely safe to continue her care as an outpatient.   Sabino Donovan, MD 10/11/20 [phone]

## 2020-10-11 NOTE — ED Triage Notes (Addendum)
Per EMS, patient from home, c/o swelling with abscess to bilateral legs. Seen at [facility] x2. Reportedly left AMA today. Ambulatory.  Significant swelling to lower extremities. Patient drowsy with difficulty staying awake during triage.

## 2020-10-11 NOTE — ED Provider Notes (Signed)
[facility] EMERGENCY DEPARTMENT Provider Note   CSN: [id number] Arrival date & time: 10/11/20  0147     History Chief Complaint  Patient presents with  . Pain    Chronic wounds    [name] [name] is a 30 y.o. female.  The history is provided by the patient.  Leg Pain Location:  Leg Leg location:  L lower leg and R lower leg Pain details:    Quality:  Aching   Radiates to:  Does not radiate   Severity:  Severe   Onset quality:  Gradual   Timing:  Constant   Progression:  Worsening Chronicity:  Chronic Foreign body present:  No foreign bodies Relieved by:  Nothing Worsened by:  Nothing Ineffective treatments:  None tried (PMD is refusing to reorder narcotic pain medication) Associated symptoms: no fever   Patient with lymphedema and chronic wounds and leg pain seen multiple times this week for same as her doctor is not refilling her chronic narcotic medications.  No f/c/r.       Past Medical History:  Diagnosis Date  . DM (diabetes mellitus) (HCC)   . S/P debridement     Patient Active Problem List   Diagnosis Date Noted  . Lymphedema 09/28/2020  . Cellulitis 09/27/2020  . Chronic pain syndrome 09/27/2020  . Bilateral edema of lower extremity 09/27/2020  . Anemia 09/27/2020  . Obesity, Class III, BMI 40-49.9 (morbid obesity) (HCC) 09/27/2020    History reviewed. No pertinent surgical history.   OB History   No obstetric history on file.     History reviewed. No pertinent family history.  Social History   Tobacco Use  . Smoking status: Never Smoker  . Smokeless tobacco: Never Used  Substance Use Topics  . Alcohol use: Not Currently    Home Medications Prior to Admission medications   Medication Sig Start Date End Date Taking? Authorizing Provider  gabapentin (NEURONTIN) 600 MG tablet Take 600 mg by mouth 3 (three) times daily. 09/15/20  Yes [provider]  lidocaine (LIDODERM) 5 % Place 1 patch onto the skin daily. Remove &  Discard patch within 12 hours or as directed by MD 10/11/20  Yes Josy Peaden, MD  amoxicillin-clavulanate (AUGMENTIN) 875-125 MG tablet Take 1 tablet by mouth 2 (two) times daily for 10 days. 10/01/20 10/11/20  Alwyn Ren, MD  doxycycline (VIBRAMYCIN) 50 MG capsule Take 2 capsules (100 mg total) by mouth 2 (two) times daily. 10/01/20   Alwyn Ren, MD  DULoxetine (CYMBALTA) 30 MG capsule Take 90 mg by mouth daily. 08/11/20   [provider]  ferrous gluconate (FERGON) 324 MG tablet Take 1 tablet (324 mg total) by mouth daily with breakfast. 10/02/20   Alwyn Ren, MD  ibuprofen (ADVIL) 200 MG tablet Take 400 mg by mouth every 6 (six) hours as needed for headache, mild pain or moderate pain.    [provider]  ondansetron (ZOFRAN) 4 MG tablet Take 1 tablet (4 mg total) by mouth every 6 (six) hours as needed for nausea. 10/01/20   Alwyn Ren, MD  oxyCODONE 10 MG TABS Take 1 tablet (10 mg total) by mouth every 4 (four) hours as needed for moderate pain. 10/01/20   Alwyn Ren, MD  polyethylene glycol (MIRALAX / GLYCOLAX) 17 g packet Take 17 g by mouth daily. 10/01/20   Alwyn Ren, MD  tiZANidine (ZANAFLEX) 4 MG tablet Take 4 mg by mouth 3 (three) times daily as needed for muscle  spasms. 09/05/20   [provider]  vitamin C (ASCORBIC ACID) 250 MG tablet Take 2 tablets (500 mg total) by mouth 2 (two) times daily. Patient not taking: No sig reported 10/01/20   Alwyn Ren, MD    Allergies    Silver  Review of Systems   Review of Systems  Constitutional: Negative for fever.  HENT: Negative for congestion.   Eyes: Negative for visual disturbance.  Respiratory: Negative for shortness of breath.   Cardiovascular: Negative for chest pain.  Gastrointestinal: Negative for abdominal distention.  Genitourinary: Negative for difficulty urinating.  Musculoskeletal: Positive for arthralgias.  Skin: Positive for wound.   Neurological: Negative for dizziness.  Psychiatric/Behavioral: Negative for agitation.  All other systems reviewed and are negative.   Physical Exam Updated Vital Signs BP 118/62 (BP Location: Right Arm)   Pulse 82   Temp 98.2 F (36.8 C) (Oral)   Resp 18   Ht 5\' 7"  (1.702 m)   Wt 128.4 kg   SpO2 100%   BMI 44.32 kg/m   Physical Exam Vitals and nursing note reviewed.  Constitutional:      General: She is not in acute distress.    Appearance: Normal appearance.     Comments: Somnolent   HENT:     Head: Normocephalic and atraumatic.     Nose: Nose normal.  Eyes:     Conjunctiva/sclera: Conjunctivae normal.     Pupils: Pupils are equal, round, and reactive to light.  Cardiovascular:     Rate and Rhythm: Normal rate and regular rhythm.     Pulses: Normal pulses.     Heart sounds: Normal heart sounds.  Pulmonary:     Effort: Pulmonary effort is normal.     Breath sounds: Normal breath sounds.  Abdominal:     General: Abdomen is flat. Bowel sounds are normal.     Palpations: Abdomen is soft.     Tenderness: There is no abdominal tenderness. There is no guarding or rebound.  Musculoskeletal:        General: Normal range of motion.     Cervical back: Normal range of motion and neck supple.  Skin:    General: Skin is warm and dry.     Capillary Refill: Capillary refill takes less than 2 seconds.     Comments: No warmth, no erythema no fluctuance of the BLE  Neurological:     General: No focal deficit present.     Mental Status: She is alert and oriented to person, place, and time.  Psychiatric:        Thought Content: Thought content normal.     ED Results / Procedures / Treatments   Labs (all labs ordered are listed, but only abnormal results are displayed) Labs Reviewed - No data to display  EKG None  Radiology No results found.  Procedures Procedures   Medications Ordered in ED Medications  lidocaine (XYLOCAINE) 2 % jelly 1 application (has no  administration in time range)  oxyCODONE-acetaminophen (PERCOCET/ROXICET) 5-325 MG per tablet 1 tablet (has no administration in time range)  ketorolac (TORADOL) injection 60 mg (60 mg Intramuscular Given 10/11/20 0220)    ED Course  I have reviewed the triage vital signs and the nursing notes.  Pertinent labs & imaging results that were available during my care of the patient were reviewed by me and considered in my medical decision making (see chart for details).   Patient was given pain medication in the ED. Patient is already somnolent  and I will not be giving multiple rounds of subcutaneous medication. No changes in the wounds or lymphedema. I will refer to pain management.    [name] [name] was evaluated in Emergency Department on 10/11/2020 for the symptoms described in the history of present illness. She was evaluated in the context of the global COVID-19 pandemic, which necessitated consideration that the patient might be at risk for infection with the SARS-CoV-2 virus that causes COVID-19. Institutional protocols and algorithms that pertain to the evaluation of patients at risk for COVID-19 are in a state of rapid change based on information released by regulatory bodies including the CDC and federal and state organizations. These policies and algorithms were followed during the patient's care in the ED.  Final Clinical Impression(s) / ED Diagnoses Final diagnoses:  Other chronic pain   Return for intractable cough, coughing up blood, fevers >100.4 unrelieved by medication, shortness of breath, intractable vomiting, chest pain, shortness of breath, weakness, numbness, changes in speech, facial asymmetry, abdominal pain, passing out, Inability to tolerate liquids or food, cough, altered mental status or any concerns. No signs of systemic illness or infection. The patient is nontoxic-appearing on exam and vital signs are within normal limits.  I have reviewed the triage vital signs and the  nursing notes. Pertinent labs & imaging results that were available during my care of the patient were reviewed by me and considered in my medical decision making (see chart for details). After history, exam, and medical workup I feel the patient has been appropriately medically screened and is safe for discharge home. Pertinent diagnoses were discussed with the patient. Patient was given return precautions.    Rx / DC Orders ED Discharge Orders         Ordered    lidocaine (LIDODERM) 5 %  Every 24 hours        10/11/20 0248           Lindsay Soulliere, MD 10/11/20 0330

## 2020-10-11 NOTE — ED Triage Notes (Signed)
Brought in by [facility].  Severe pain in bilateral LE due to Significant lymphedema and open wounds.  Is out of her pain medicines.  Was seen here in ED last night and given Toradol which helped for a few hours.

## 2020-10-11 NOTE — Discharge Instructions (Addendum)
As discussed, please see your wound care doctor and pain specialist for optimal management of the wound and the pain associated with it. Emergency room is not the best place for chronic pain management.

## 2020-10-11 NOTE — ED Notes (Signed)
Provided pt with a ham sandwich and gingerale

## 2020-10-11 NOTE — ED Triage Notes (Signed)
Unable to get accurate BP; please try manual when pt. In a room

## 2020-10-11 NOTE — ED Notes (Signed)
Pt declines to get in bed, however falling asleep midsentence in chair. Asked by multiple staff members to get in bed if she cannot stay awake. Continues to decline to sit in bed. Made aware of safety risk.

## 2020-10-14 ENCOUNTER — Emergency Department (HOSPITAL_BASED_OUTPATIENT_CLINIC_OR_DEPARTMENT_OTHER): Payer: Medicaid Other

## 2020-10-14 ENCOUNTER — Other Ambulatory Visit: Payer: Self-pay

## 2020-10-14 ENCOUNTER — Encounter (HOSPITAL_BASED_OUTPATIENT_CLINIC_OR_DEPARTMENT_OTHER): Payer: Self-pay | Admitting: Emergency Medicine

## 2020-10-14 ENCOUNTER — Emergency Department (HOSPITAL_BASED_OUTPATIENT_CLINIC_OR_DEPARTMENT_OTHER)
Admission: EM | Admit: 2020-10-14 | Discharge: 2020-10-14 | Disposition: A | Discharge status: Home / Self Care | Payer: Medicaid Other | Attending: Emergency Medicine | Admitting: Emergency Medicine

## 2020-10-14 DIAGNOSIS — S8012XA Contusion of left lower leg, initial encounter: Secondary | ICD-10-CM | POA: Insufficient documentation

## 2020-10-14 DIAGNOSIS — W06XXXA Fall from bed, initial encounter: Secondary | ICD-10-CM | POA: Insufficient documentation

## 2020-10-14 DIAGNOSIS — S8992XA Unspecified injury of left lower leg, initial encounter: Secondary | ICD-10-CM | POA: Diagnosis present

## 2020-10-14 DIAGNOSIS — E119 Type 2 diabetes mellitus without complications: Secondary | ICD-10-CM | POA: Diagnosis not present

## 2020-10-14 HISTORY — DX: Non-pressure chronic ulcer of unspecified part of unspecified lower leg with unspecified severity: L97.909

## 2020-10-14 HISTORY — DX: Other chronic pain: G89.29

## 2020-10-14 HISTORY — DX: Non-pressure chronic ulcer of unspecified part of unspecified lower leg with unspecified severity: I83.009

## 2020-10-14 HISTORY — DX: Lymphedema, not elsewhere classified: I89.0

## 2020-10-14 MED ORDER — HYDROCODONE-ACETAMINOPHEN 5-325 MG PO TABS
1.0000 | ORAL_TABLET | Freq: Once | ORAL | Status: AC
Start: 2020-10-14 — End: 2020-10-14
  Administered 2020-10-14: 1 via ORAL
  Filled 2020-10-14: qty 1

## 2020-10-14 MED ORDER — IBUPROFEN 400 MG PO TABS
400.0000 mg | ORAL_TABLET | Freq: Once | ORAL | Status: AC
  Administered 2020-10-14: 400 mg via ORAL
  Filled 2020-10-14: qty 1

## 2020-10-14 NOTE — ED Triage Notes (Signed)
Pt states she fell out of bed and hit left leg on floor. Pt has chronic wound on left leg. No new injury visible.

## 2020-10-14 NOTE — Discharge Instructions (Addendum)
You may take ibuprofen, available over-the-counter according to label instructions as needed for pain. You may apply cool compresses to the sore area on your left leg. Please follow-up with your family doctor for recheck in the next few days.

## 2020-10-14 NOTE — ED Triage Notes (Addendum)
Pt is sitting in chair and refuses to get on to stretcher due to pain. Pt is able to ambulate in room without assistance.

## 2020-10-14 NOTE — ED Provider Notes (Signed)
[facility] EMERGENCY DEPARTMENT Provider Note   CSN: [id number] Arrival date & time: 10/14/20  2751     History Chief Complaint  Patient presents with  . Leg Pain    [name] [name] is a 30 y.o. female.  The history is provided by the patient and medical records.  Leg Pain  [name] [name] is a 30 y.o. female who presents to the Emergency Department complaining of leg pain. She presents the emergency department by EMS for evaluation of left leg pain. She states that she was asleep in bed and rolled out of bed and struck her left calf on the hardwood floor. She has a history of diabetes, chronic pain and lymph edema to bilateral lower extremities. She also is out of her Norco's and is scheduled to get a refill later today. She has been out of the medication for the last two weeks.    Past Medical History:  Diagnosis Date  . Chronic pain of both lower extremities   . DM (diabetes mellitus) (HCC)   . Lymphedema of both lower extremities   . S/P debridement   . Venous stasis ulcer (HCC)     Patient Active Problem List   Diagnosis Date Noted  . Lymphedema 09/28/2020  . Cellulitis 09/27/2020  . Chronic pain syndrome 09/27/2020  . Bilateral edema of lower extremity 09/27/2020  . Anemia 09/27/2020  . Obesity, Class III, BMI 40-49.9 (morbid obesity) (HCC) 09/27/2020    No past surgical history on file.   OB History   No obstetric history on file.     No family history on file.  Social History   Tobacco Use  . Smoking status: Never Smoker  . Smokeless tobacco: Never Used  Substance Use Topics  . Alcohol use: Not Currently    Home Medications Prior to Admission medications   Medication Sig Start Date End Date Taking? Authorizing Provider  doxycycline (VIBRAMYCIN) 50 MG capsule Take 2 capsules (100 mg total) by mouth 2 (two) times daily. 10/01/20   Alwyn Ren, MD  DULoxetine (CYMBALTA) 30 MG capsule Take 90 mg by mouth daily. 08/11/20   [provider]  ferrous gluconate (FERGON) 324 MG tablet Take 1 tablet (324 mg total) by mouth daily with breakfast. 10/02/20   Alwyn Ren, MD  gabapentin (NEURONTIN) 600 MG tablet Take 600 mg by mouth 3 (three) times daily. 09/15/20   [provider]  ibuprofen (ADVIL) 200 MG tablet Take 400 mg by mouth every 6 (six) hours as needed for headache, mild pain or moderate pain.    [provider]  lidocaine (LIDODERM) 5 % Place 1 patch onto the skin daily. Remove & Discard patch within 12 hours or as directed by MD 10/11/20   Nicanor Alcon, April, MD  ondansetron (ZOFRAN) 4 MG tablet Take 1 tablet (4 mg total) by mouth every 6 (six) hours as needed for nausea. 10/01/20   Alwyn Ren, MD  polyethylene glycol (MIRALAX / GLYCOLAX) 17 g packet Take 17 g by mouth daily. 10/01/20   Alwyn Ren, MD  tiZANidine (ZANAFLEX) 4 MG tablet Take 4 mg by mouth 3 (three) times daily as needed for muscle spasms. 09/05/20   [provider]  vitamin C (ASCORBIC ACID) 250 MG tablet Take 2 tablets (500 mg total) by mouth 2 (two) times daily. Patient not taking: No sig reported 10/01/20   Alwyn Ren, MD    Allergies    Silver  Review of Systems   Review  of Systems  All other systems reviewed and are negative.   Physical Exam Updated Vital Signs BP 131/68   Pulse 99   Temp 97.9 F (36.6 C) (Oral)   Resp 20   Ht 5\' 7"  (1.702 m)   Wt 128 kg   SpO2 100%   BMI 44.20 kg/m   Physical Exam Vitals and nursing note reviewed.  Constitutional:      Appearance: She is well-developed.  HENT:     Head: Normocephalic and atraumatic.  Cardiovascular:     Rate and Rhythm: Normal rate and regular rhythm.  Pulmonary:     Effort: Pulmonary effort is normal. No respiratory distress.  Abdominal:     Palpations: Abdomen is soft.     Tenderness: There is no abdominal tenderness. There is no guarding or rebound.  Musculoskeletal:     Comments: Examination is slightly  limited as patient prefers to be seated in a chair for evaluation. Bilateral lower extremities with chronic venous stasis changes with significant soft tissue swelling. 2+ left DP pulse. There is a large ulceration to the posterior lateral aspect of the left distal leg with a clean base. There is no significant surrounding erythema. There is tenderness to palpation over the left calf. Edema appears relatively symmetric between bilateral lower extremities. There is no bony tenderness to the foot or knee.  Skin:    General: Skin is warm and dry.  Neurological:     Mental Status: She is alert and oriented to person, place, and time.     Comments: Shuffling gait  Psychiatric:     Comments: Tearful and crying     ED Results / Procedures / Treatments   Labs (all labs ordered are listed, but only abnormal results are displayed) Labs Reviewed - No data to display  EKG None  Radiology DG Tibia/Fibula Left  Result Date: 10/14/2020 CLINICAL DATA:  Status post fall with a chronic left leg wound. EXAM: LEFT TIBIA AND FIBULA - 2 VIEW COMPARISON:  None. FINDINGS: There is no evidence of acute fracture or other focal bone lesions. Marked severity diffuse soft tissue swelling is seen secondary to the patient's body habitus. A large chronic, lateral soft tissue defect is seen. This measures approximately 18.7 cm in length. IMPRESSION: Large, chronic lateral left leg wound, without an acute osseous abnormality. Electronically Signed   By: 12/14/2020 M.D.   On: 10/14/2020 03:36    Procedures Procedures   Medications Ordered in ED Medications  ibuprofen (ADVIL) tablet 400 mg (has no administration in time range)  HYDROcodone-acetaminophen (NORCO/VICODIN) 5-325 MG per tablet 1 tablet (1 tablet Oral Given 10/14/20 0301)    ED Course  I have reviewed the triage vital signs and the nursing notes.  Pertinent labs & imaging results that were available during my care of the patient were reviewed by me  and considered in my medical decision making (see chart for details).    MDM Rules/Calculators/A&P                         patient with history of lymphedema, chronic wound to the left leg as well as chronic pain syndrome here for evaluation after falling out of bed. She has tenderness to the left calf, no abrasion or significant ecchymosis. No evidence of active infection at this time. Imaging is negative for acute fracture. Discussed with patient home care for contusion to the leg. Discussed outpatient follow-up and return precautions.  Final Clinical Impression(s) /  ED Diagnoses Final diagnoses:  Contusion of left lower extremity, initial encounter    Rx / DC Orders ED Discharge Orders    None       Tilden Fossa, MD 10/14/20 [phone]

## 2020-10-14 NOTE — ED Notes (Signed)
ED Provider at bedside. 

## 2020-10-25 ENCOUNTER — Emergency Department (HOSPITAL_COMMUNITY)
Admission: EM | Admit: 2020-10-25 | Discharge: 2020-10-25 | Disposition: A | Discharge status: Home / Self Care | Payer: Medicaid Other | Attending: Emergency Medicine | Admitting: Emergency Medicine

## 2020-10-25 ENCOUNTER — Encounter (HOSPITAL_COMMUNITY): Payer: Self-pay | Admitting: Emergency Medicine

## 2020-10-25 ENCOUNTER — Other Ambulatory Visit: Payer: Self-pay

## 2020-10-25 DIAGNOSIS — I890 Lymphedema, not elsewhere classified: Secondary | ICD-10-CM | POA: Diagnosis present

## 2020-10-25 DIAGNOSIS — E119 Type 2 diabetes mellitus without complications: Secondary | ICD-10-CM | POA: Insufficient documentation

## 2020-10-25 LAB — COMPREHENSIVE METABOLIC PANEL
ALT: 12 U/L (ref 0–44)
AST: 15 U/L (ref 15–41)
Albumin: 3 g/dL — ABNORMAL LOW (ref 3.5–5.0)
Alkaline Phosphatase: 131 U/L — ABNORMAL HIGH (ref 38–126)
Anion gap: 8 (ref 5–15)
BUN: 11 mg/dL (ref 6–20)
CO2: 27 mmol/L (ref 22–32)
Calcium: 8.8 mg/dL — ABNORMAL LOW (ref 8.9–10.3)
Chloride: 103 mmol/L (ref 98–111)
Creatinine, Ser: 0.51 mg/dL (ref 0.44–1.00)
GFR, Estimated: >60 mL/min (ref >60)
Glucose, Bld: 96 mg/dL (ref 70–99)
Potassium: 3.2 mmol/L — ABNORMAL LOW (ref 3.5–5.1)
Sodium: 138 mmol/L (ref 135–145)
Total Bilirubin: 0.2 mg/dL — ABNORMAL LOW (ref 0.3–1.2)
Total Protein: 8.9 g/dL — ABNORMAL HIGH (ref 6.5–8.1)

## 2020-10-25 LAB — CBC WITH DIFFERENTIAL/PLATELET
Abs Immature Granulocytes: 0.01 10*3/uL (ref 0.00–0.07)
Basophils Absolute: 0 10*3/uL (ref 0.0–0.1)
Basophils Relative: 0 %
Eosinophils Absolute: 0.2 10*3/uL (ref 0.0–0.5)
Eosinophils Relative: 2 %
HCT: 31.1 % — ABNORMAL LOW (ref 36.0–46.0)
Hemoglobin: 9.1 g/dL — ABNORMAL LOW (ref 12.0–15.0)
Immature Granulocytes: 0 %
Lymphocytes Relative: 21 %
Lymphs Abs: 1.8 10*3/uL (ref 0.7–4.0)
MCH: 21.9 pg — ABNORMAL LOW (ref 26.0–34.0)
MCHC: 29.3 g/dL — ABNORMAL LOW (ref 30.0–36.0)
MCV: 74.8 fL — ABNORMAL LOW (ref 80.0–100.0)
Monocytes Absolute: 0.6 10*3/uL (ref 0.1–1.0)
Monocytes Relative: 7 %
Neutro Abs: 5.8 10*3/uL (ref 1.7–7.7)
Neutrophils Relative %: 70 %
Platelets: 485 10*3/uL — ABNORMAL HIGH (ref 150–400)
RBC: 4.16 MIL/uL (ref 3.87–5.11)
RDW: 19.8 % — ABNORMAL HIGH (ref 11.5–15.5)
WBC: 8.4 10*3/uL (ref 4.0–10.5)
nRBC: 0 % (ref 0.0–0.2)

## 2020-10-25 LAB — I-STAT BETA HCG BLOOD, ED (MC, WL, AP ONLY): I-stat hCG, quantitative: <5 m[IU]/mL (ref <5)

## 2020-10-25 LAB — LACTIC ACID, PLASMA
Lactic Acid, Venous: 0.8 mmol/L (ref 0.5–1.9)
Lactic Acid, Venous: 1 mmol/L (ref 0.5–1.9)

## 2020-10-25 MED ORDER — OXYCODONE-ACETAMINOPHEN 5-325 MG PO TABS: 1.0000 | ORAL_TABLET | Freq: Three times a day (TID) | ORAL | 0 refills | Status: DC | PRN

## 2020-10-25 MED ORDER — MORPHINE SULFATE (PF) 2 MG/ML IV SOLN
2.0000 mg | Freq: Once | INTRAVENOUS | Status: AC
  Administered 2020-10-25: 2 mg via INTRAVENOUS
  Filled 2020-10-25: qty 1

## 2020-10-25 MED ORDER — HYDROCODONE-ACETAMINOPHEN 5-325 MG PO TABS
1.0000 | ORAL_TABLET | Freq: Once | ORAL | Status: AC
  Administered 2020-10-25: 1 via ORAL
  Filled 2020-10-25: qty 1

## 2020-10-25 MED ORDER — OXYCODONE-ACETAMINOPHEN 5-325 MG PO TABS
1.0000 | ORAL_TABLET | Freq: Once | ORAL | Status: AC
  Administered 2020-10-25: 1 via ORAL
  Filled 2020-10-25: qty 1

## 2020-10-25 NOTE — ED Notes (Signed)
Patient given something to eat/drink per request.

## 2020-10-25 NOTE — ED Provider Notes (Signed)
[facility]-EMERGENCY DEPT Provider Note   CSN: [id number] Arrival date & time: 10/25/20  1442     History Chief Complaint  Patient presents with  . Wound Infection    [name] [name] is a 30 y.o. female.  30 year old female with prior medical history as detailed below presents for evaluation.  Patient with complaint of increased pain to both lower extremities.  Patient is tearful and distraught during exam.  She reports drainage from chronic wounds.  She reports fever at home.  This was subjective.  She also complains of increased edema to both lower extremities.  The history is provided by the patient and medical records.  Illness Location:  Lower extremity pain and swelling Severity:  Moderate Onset quality:  Unable to specify Timing:  Unable to specify Chronicity:  Chronic Associated symptoms: fever        Past Medical History:  Diagnosis Date  . Chronic pain of both lower extremities   . DM (diabetes mellitus) (HCC)   . Lymphedema of both lower extremities   . S/P debridement   . Venous stasis ulcer (HCC)     Patient Active Problem List   Diagnosis Date Noted  . Lymphedema 09/28/2020  . Cellulitis 09/27/2020  . Chronic pain syndrome 09/27/2020  . Bilateral edema of lower extremity 09/27/2020  . Anemia 09/27/2020  . Obesity, Class III, BMI 40-49.9 (morbid obesity) (HCC) 09/27/2020    History reviewed. No pertinent surgical history.   OB History   No obstetric history on file.     No family history on file.  Social History   Tobacco Use  . Smoking status: Never Smoker  . Smokeless tobacco: Never Used  Substance Use Topics  . Alcohol use: Not Currently    Home Medications Prior to Admission medications   Medication Sig Start Date End Date Taking? Authorizing Provider  doxycycline (VIBRAMYCIN) 50 MG capsule Take 2 capsules (100 mg total) by mouth 2 (two) times daily. 10/01/20   Alwyn Ren, MD  DULoxetine (CYMBALTA)  30 MG capsule Take 90 mg by mouth daily. 08/11/20   [provider]  ferrous gluconate (FERGON) 324 MG tablet Take 1 tablet (324 mg total) by mouth daily with breakfast. 10/02/20   Alwyn Ren, MD  gabapentin (NEURONTIN) 600 MG tablet Take 600 mg by mouth 3 (three) times daily. 09/15/20   [provider]  ibuprofen (ADVIL) 200 MG tablet Take 400 mg by mouth every 6 (six) hours as needed for headache, mild pain or moderate pain.    [provider]  lidocaine (LIDODERM) 5 % Place 1 patch onto the skin daily. Remove & Discard patch within 12 hours or as directed by MD 10/11/20   Nicanor Alcon, April, MD  ondansetron (ZOFRAN) 4 MG tablet Take 1 tablet (4 mg total) by mouth every 6 (six) hours as needed for nausea. 10/01/20   Alwyn Ren, MD  polyethylene glycol (MIRALAX / GLYCOLAX) 17 g packet Take 17 g by mouth daily. 10/01/20   Alwyn Ren, MD  tiZANidine (ZANAFLEX) 4 MG tablet Take 4 mg by mouth 3 (three) times daily as needed for muscle spasms. 09/05/20   [provider]  vitamin C (ASCORBIC ACID) 250 MG tablet Take 2 tablets (500 mg total) by mouth 2 (two) times daily. Patient not taking: No sig reported 10/01/20   Alwyn Ren, MD    Allergies    Silver  Review of Systems   Review of Systems  Constitutional: Positive for  fever.  All other systems reviewed and are negative.   Physical Exam Updated Vital Signs BP (!) 138/105   Pulse 95   Temp 98.3 F (36.8 C) (Oral)   Resp 17   Ht 5\' 7"  (1.702 m)   SpO2 100%   BMI 44.20 kg/m   Physical Exam Vitals and nursing note reviewed.  Constitutional:      General: She is not in acute distress.    Appearance: Normal appearance. She is well-developed.  HENT:     Head: Normocephalic and atraumatic.  Eyes:     Conjunctiva/sclera: Conjunctivae normal.     Pupils: Pupils are equal, round, and reactive to light.  Cardiovascular:     Rate and Rhythm: Normal rate and regular rhythm.      Heart sounds: Normal heart sounds.  Pulmonary:     Effort: Pulmonary effort is normal. No respiratory distress.     Breath sounds: Normal breath sounds.  Abdominal:     General: There is no distension.     Palpations: Abdomen is soft.     Tenderness: There is no abdominal tenderness.  Musculoskeletal:        General: No deformity. Normal range of motion.     Cervical back: Normal range of motion and neck supple.     Right lower leg: Edema present.     Left lower leg: Edema present.     Comments: Chronic wounds bilaterally - see images below   Skin:    General: Skin is warm and dry.  Neurological:     General: No focal deficit present.     Mental Status: She is alert and oriented to person, place, and time. Mental status is at baseline.         ED Results / Procedures / Treatments   Labs (all labs ordered are listed, but only abnormal results are displayed) Labs Reviewed  CBC WITH DIFFERENTIAL/PLATELET - Abnormal; Notable for the following components:      Result Value   Hemoglobin 9.1 (*)    HCT 31.1 (*)    MCV 74.8 (*)    MCH 21.9 (*)    MCHC 29.3 (*)    RDW 19.8 (*)    Platelets 485 (*)    All other components within normal limits  COMPREHENSIVE METABOLIC PANEL  LACTIC ACID, PLASMA  LACTIC ACID, PLASMA  I-STAT BETA HCG BLOOD, ED ([facility], [facility], [facility] ONLY)    EKG None  Radiology No results found.  Procedures Procedures   Medications Ordered in ED Medications  HYDROcodone-acetaminophen (NORCO/VICODIN) 5-325 MG per tablet 1 tablet (1 tablet Oral Given 10/25/20 1517)    ED Course  I have reviewed the triage vital signs and the nursing notes.  Pertinent labs & imaging results that were available during my care of the patient were reviewed by me and considered in my medical decision making (see chart for details).    MDM Rules/Calculators/A&P                          MDM  MSE complete  [name] [name] was evaluated in Emergency Department on 10/25/2020 for  the symptoms described in the history of present illness. She was evaluated in the context of the global COVID-19 pandemic, which necessitated consideration that the patient might be at risk for infection with the SARS-CoV-2 virus that causes COVID-19. Institutional protocols and algorithms that pertain to the evaluation of patients at risk for COVID-19 are in a state  of rapid change based on information released by regulatory bodies including the CDC and federal and state organizations. These policies and algorithms were followed during the patient's care in the ED.  Patient is presenting for evaluation of reported lower extremity pain.  This appears to be a chronic complaint.  Exam is not suggestive of acute infection.  Screening labs obtained are without significant acute abnormality.  Patient is improved after ED evaluation.  She does understand need for close outpatient follow-up.  Strict return precautions given and understood.     Final Clinical Impression(s) / ED Diagnoses Final diagnoses:  Lymphedema    Rx / DC Orders ED Discharge Orders         Ordered    oxyCODONE-acetaminophen (PERCOCET/ROXICET) 5-325 MG tablet  Every 8 hours PRN        10/25/20 1948           Wynetta Fines, MD 10/25/20 1950

## 2020-10-25 NOTE — Discharge Instructions (Addendum)
Return for any problem.  ?

## 2020-10-25 NOTE — ED Triage Notes (Signed)
Patient BIB [facility], patient has bilateral lower extremity edema with open wounds. Patient reported to ems that her legs have tripled in size since falling two days ago.

## 2020-10-25 NOTE — ED Notes (Signed)
Bilateral leg wounds dressed with ABD pads and gauze per patient request.

## 2020-10-25 NOTE — ED Notes (Signed)
Non-emergent line called for transport home.

## 2020-10-27 ENCOUNTER — Encounter (HOSPITAL_COMMUNITY): Payer: Self-pay

## 2020-10-27 ENCOUNTER — Other Ambulatory Visit: Payer: Self-pay

## 2020-10-27 ENCOUNTER — Inpatient Hospital Stay (HOSPITAL_COMMUNITY)
Admission: EM | Admit: 2020-10-27 | Discharge: 2020-10-29 | DRG: 603 | Disposition: A | Discharge status: Home / Self Care | Payer: Medicaid Other | Attending: Internal Medicine | Admitting: Internal Medicine

## 2020-10-27 DIAGNOSIS — L03116 Cellulitis of left lower limb: Principal | ICD-10-CM | POA: Diagnosis present

## 2020-10-27 DIAGNOSIS — L03119 Cellulitis of unspecified part of limb: Secondary | ICD-10-CM | POA: Diagnosis present

## 2020-10-27 DIAGNOSIS — L03115 Cellulitis of right lower limb: Secondary | ICD-10-CM | POA: Diagnosis present

## 2020-10-27 DIAGNOSIS — D649 Anemia, unspecified: Secondary | ICD-10-CM | POA: Diagnosis present

## 2020-10-27 DIAGNOSIS — Z6841 Body Mass Index (BMI) 40.0 and over, adult: Secondary | ICD-10-CM

## 2020-10-27 DIAGNOSIS — E11622 Type 2 diabetes mellitus with other skin ulcer: Secondary | ICD-10-CM | POA: Diagnosis present

## 2020-10-27 DIAGNOSIS — Z20822 Contact with and (suspected) exposure to covid-19: Secondary | ICD-10-CM | POA: Diagnosis present

## 2020-10-27 DIAGNOSIS — Z79899 Other long term (current) drug therapy: Secondary | ICD-10-CM

## 2020-10-27 DIAGNOSIS — L97929 Non-pressure chronic ulcer of unspecified part of left lower leg with unspecified severity: Secondary | ICD-10-CM | POA: Diagnosis present

## 2020-10-27 DIAGNOSIS — L0390 Cellulitis, unspecified: Secondary | ICD-10-CM | POA: Diagnosis present

## 2020-10-27 DIAGNOSIS — I890 Lymphedema, not elsewhere classified: Secondary | ICD-10-CM | POA: Diagnosis present

## 2020-10-27 DIAGNOSIS — L97919 Non-pressure chronic ulcer of unspecified part of right lower leg with unspecified severity: Secondary | ICD-10-CM | POA: Diagnosis present

## 2020-10-27 DIAGNOSIS — G894 Chronic pain syndrome: Secondary | ICD-10-CM | POA: Diagnosis present

## 2020-10-27 DIAGNOSIS — Z91048 Other nonmedicinal substance allergy status: Secondary | ICD-10-CM

## 2020-10-27 LAB — COMPREHENSIVE METABOLIC PANEL
ALT: 12 U/L (ref 0–44)
AST: 17 U/L (ref 15–41)
Albumin: 2.9 g/dL — ABNORMAL LOW (ref 3.5–5.0)
Alkaline Phosphatase: 109 U/L (ref 38–126)
Anion gap: 7 (ref 5–15)
BUN: 9 mg/dL (ref 6–20)
CO2: 28 mmol/L (ref 22–32)
Calcium: 8.6 mg/dL — ABNORMAL LOW (ref 8.9–10.3)
Chloride: 103 mmol/L (ref 98–111)
Creatinine, Ser: 0.59 mg/dL (ref 0.44–1.00)
GFR, Estimated: >60 mL/min (ref >60)
Glucose, Bld: 98 mg/dL (ref 70–99)
Potassium: 3.2 mmol/L — ABNORMAL LOW (ref 3.5–5.1)
Sodium: 138 mmol/L (ref 135–145)
Total Bilirubin: 0.2 mg/dL — ABNORMAL LOW (ref 0.3–1.2)
Total Protein: 8.6 g/dL — ABNORMAL HIGH (ref 6.5–8.1)

## 2020-10-27 LAB — CBC WITH DIFFERENTIAL/PLATELET
Abs Immature Granulocytes: 0.03 10*3/uL (ref 0.00–0.07)
Basophils Absolute: 0 10*3/uL (ref 0.0–0.1)
Basophils Relative: 0 %
Eosinophils Absolute: 0.2 10*3/uL (ref 0.0–0.5)
Eosinophils Relative: 2 %
HCT: 31.5 % — ABNORMAL LOW (ref 36.0–46.0)
Hemoglobin: 9.4 g/dL — ABNORMAL LOW (ref 12.0–15.0)
Immature Granulocytes: 0 %
Lymphocytes Relative: 17 %
Lymphs Abs: 1.7 10*3/uL (ref 0.7–4.0)
MCH: 22.4 pg — ABNORMAL LOW (ref 26.0–34.0)
MCHC: 29.8 g/dL — ABNORMAL LOW (ref 30.0–36.0)
MCV: 75.2 fL — ABNORMAL LOW (ref 80.0–100.0)
Monocytes Absolute: 0.8 10*3/uL (ref 0.1–1.0)
Monocytes Relative: 8 %
Neutro Abs: 7.4 10*3/uL (ref 1.7–7.7)
Neutrophils Relative %: 73 %
Platelets: 450 10*3/uL — ABNORMAL HIGH (ref 150–400)
RBC: 4.19 MIL/uL (ref 3.87–5.11)
RDW: 19.8 % — ABNORMAL HIGH (ref 11.5–15.5)
WBC: 10.1 10*3/uL (ref 4.0–10.5)
nRBC: 0 % (ref 0.0–0.2)

## 2020-10-27 LAB — RESP PANEL BY RT-PCR (FLU A&B, COVID) ARPGX2
Influenza A by PCR: NEGATIVE
Influenza B by PCR: NEGATIVE
SARS Coronavirus 2 by RT PCR: NEGATIVE

## 2020-10-27 LAB — LACTIC ACID, PLASMA: Lactic Acid, Venous: 1.1 mmol/L (ref 0.5–1.9)

## 2020-10-27 MED ORDER — SODIUM CHLORIDE 0.9 % IV SOLN
2.0000 g | Freq: Once | INTRAVENOUS | Status: AC
  Administered 2020-10-27: 2 g via INTRAVENOUS
  Filled 2020-10-27: qty 2

## 2020-10-27 MED ORDER — ONDANSETRON HCL 4 MG/2ML IJ SOLN
4.0000 mg | Freq: Once | INTRAMUSCULAR | Status: AC
  Administered 2020-10-27: 4 mg via INTRAVENOUS
  Filled 2020-10-27: qty 2

## 2020-10-27 MED ORDER — FERROUS GLUCONATE 324 (38 FE) MG PO TABS
324.0000 mg | ORAL_TABLET | Freq: Every day | ORAL | Status: DC
  Administered 2020-10-28: 324 mg via ORAL
  Filled 2020-10-27 (×2): qty 1

## 2020-10-27 MED ORDER — SODIUM CHLORIDE 0.9 % IV SOLN: INTRAVENOUS | Status: DC

## 2020-10-27 MED ORDER — LACTATED RINGERS IV BOLUS
1000.0000 mL | Freq: Once | INTRAVENOUS | Status: AC
  Administered 2020-10-27: 1000 mL via INTRAVENOUS

## 2020-10-27 MED ORDER — GABAPENTIN 300 MG PO CAPS
600.0000 mg | ORAL_CAPSULE | Freq: Three times a day (TID) | ORAL | Status: DC
  Administered 2020-10-27 – 2020-10-29 (×5): 600 mg via ORAL
  Filled 2020-10-27 (×5): qty 2

## 2020-10-27 MED ORDER — VANCOMYCIN HCL 2000 MG/400ML IV SOLN
2000.0000 mg | Freq: Once | INTRAVENOUS | Status: AC
  Administered 2020-10-27: 2000 mg via INTRAVENOUS
  Filled 2020-10-27: qty 400

## 2020-10-27 MED ORDER — LIDOCAINE 5 % EX PTCH
1.0000 | MEDICATED_PATCH | CUTANEOUS | Status: DC
  Administered 2020-10-28 – 2020-10-29 (×2): 1 via TRANSDERMAL
  Filled 2020-10-27 (×2): qty 1

## 2020-10-27 MED ORDER — SODIUM CHLORIDE 0.9 % IV SOLN
2.0000 g | Freq: Three times a day (TID) | INTRAVENOUS | Status: DC
  Administered 2020-10-28: 2 g via INTRAVENOUS
  Filled 2020-10-27 (×2): qty 2

## 2020-10-27 MED ORDER — TIZANIDINE HCL 4 MG PO TABS
4.0000 mg | ORAL_TABLET | Freq: Every day | ORAL | Status: DC
  Administered 2020-10-28 (×2): 4 mg via ORAL
  Filled 2020-10-27 (×2): qty 1

## 2020-10-27 MED ORDER — HYDROMORPHONE HCL 1 MG/ML IJ SOLN
1.0000 mg | INTRAMUSCULAR | Status: DC | PRN
  Administered 2020-10-27 – 2020-10-28 (×2): 1 mg via INTRAVENOUS
  Filled 2020-10-27 (×2): qty 1

## 2020-10-27 MED ORDER — ASCORBIC ACID 500 MG PO TABS
500.0000 mg | ORAL_TABLET | Freq: Two times a day (BID) | ORAL | Status: DC
  Administered 2020-10-28 – 2020-10-29 (×4): 500 mg via ORAL
  Filled 2020-10-27 (×4): qty 1

## 2020-10-27 MED ORDER — ENOXAPARIN SODIUM 60 MG/0.6ML IJ SOSY
60.0000 mg | PREFILLED_SYRINGE | INTRAMUSCULAR | Status: DC
  Administered 2020-10-28: 60 mg via SUBCUTANEOUS
  Filled 2020-10-27 (×2): qty 0.6

## 2020-10-27 MED ORDER — POLYETHYLENE GLYCOL 3350 17 G PO PACK
17.0000 g | PACK | Freq: Every day | ORAL | Status: DC
  Administered 2020-10-28: 17 g via ORAL
  Filled 2020-10-27: qty 1

## 2020-10-27 MED ORDER — VANCOMYCIN HCL IN DEXTROSE 1-5 GM/200ML-% IV SOLN: 1000.0000 mg | Freq: Once | INTRAVENOUS | Status: DC

## 2020-10-27 MED ORDER — DOCUSATE SODIUM 100 MG PO CAPS
100.0000 mg | ORAL_CAPSULE | Freq: Every day | ORAL | Status: DC
  Administered 2020-10-28 – 2020-10-29 (×2): 100 mg via ORAL
  Filled 2020-10-27 (×2): qty 1

## 2020-10-27 MED ORDER — TIZANIDINE HCL 4 MG PO TABS
2.0000 mg | ORAL_TABLET | Freq: Every day | ORAL | Status: DC
  Administered 2020-10-28 – 2020-10-29 (×2): 2 mg via ORAL
  Filled 2020-10-27 (×2): qty 1

## 2020-10-27 MED ORDER — OXYCODONE-ACETAMINOPHEN 5-325 MG PO TABS
1.0000 | ORAL_TABLET | Freq: Four times a day (QID) | ORAL | Status: DC | PRN
  Administered 2020-10-27: 1 via ORAL
  Filled 2020-10-27: qty 1

## 2020-10-27 MED ORDER — VANCOMYCIN HCL 10 G IV SOLR
2000.0000 mg | Freq: Once | INTRAVENOUS | Status: DC
  Filled 2020-10-27: qty 2000

## 2020-10-27 MED ORDER — LACTATED RINGERS IV SOLN: INTRAVENOUS | Status: DC

## 2020-10-27 MED ORDER — MORPHINE SULFATE (PF) 4 MG/ML IV SOLN
8.0000 mg | Freq: Once | INTRAVENOUS | Status: AC
Start: 2020-10-27 — End: 2020-10-27
  Administered 2020-10-27: 8 mg via INTRAVENOUS
  Filled 2020-10-27: qty 2

## 2020-10-27 MED ORDER — VANCOMYCIN HCL IN DEXTROSE 1-5 GM/200ML-% IV SOLN
1000.0000 mg | Freq: Once | INTRAVENOUS | Status: DC
  Filled 2020-10-27: qty 200

## 2020-10-27 NOTE — ED Notes (Signed)
Pt requesting more pain meds. Admitting MD requests to see the pt prior to ordering additional pain meds. Receiving RN made aware.

## 2020-10-27 NOTE — ED Triage Notes (Signed)
Pt c/o bilateral lower leg pain from chronic wounds, states R is worse than left and it has been difficult to stand on R leg. Pt reports a fever last night of 102. Seen Sunday for same

## 2020-10-27 NOTE — ED Provider Notes (Signed)
[facility]-EMERGENCY DEPT Provider Note   CSN: [id number] Arrival date & time: 10/27/20  1811     History Chief Complaint  Patient presents with  . Leg Pain    [name] [name] is a 30 y.o. female.  30 year old female who presents with fever at home up to 102 degrees along with worsening chronic bilateral lower extremity pain.  She has chronic edema with chronic ulcers.  States that the pain at her legs have been worse and not responsive to home medications.  Has had emesis at home and cannot keep anything down.  No cough or congestion.  Denies any urinary symptoms        Past Medical History:  Diagnosis Date  . Chronic pain of both lower extremities   . DM (diabetes mellitus) (HCC)   . Lymphedema of both lower extremities   . S/P debridement   . Venous stasis ulcer (HCC)     Patient Active Problem List   Diagnosis Date Noted  . Lymphedema 09/28/2020  . Cellulitis 09/27/2020  . Chronic pain syndrome 09/27/2020  . Bilateral edema of lower extremity 09/27/2020  . Anemia 09/27/2020  . Obesity, Class III, BMI 40-49.9 (morbid obesity) (HCC) 09/27/2020    History reviewed. No pertinent surgical history.   OB History   No obstetric history on file.     History reviewed. No pertinent family history.  Social History   Tobacco Use  . Smoking status: Never Smoker  . Smokeless tobacco: Never Used  Substance Use Topics  . Alcohol use: Not Currently    Home Medications Prior to Admission medications   Medication Sig Start Date End Date Taking? Authorizing Provider  docusate sodium (COLACE) 100 MG capsule Take 100 mg by mouth daily.    [provider]  doxycycline (VIBRAMYCIN) 50 MG capsule Take 2 capsules (100 mg total) by mouth 2 (two) times daily. Patient not taking: Reported on 10/25/2020 10/01/20   Alwyn Ren, MD  ferrous gluconate [facility]) 324 MG tablet Take 1 tablet (324 mg total) by mouth daily with breakfast. 10/02/20    Alwyn Ren, MD  gabapentin (NEURONTIN) 600 MG tablet Take 600 mg by mouth 3 (three) times daily. 09/15/20   [provider]  HYDROcodone-acetaminophen (NORCO) 10-325 MG tablet Take 1 tablet by mouth 4 (four) times daily as needed for moderate pain or severe pain. 10/16/20   [provider]  ibuprofen (ADVIL) 200 MG tablet Take 400 mg by mouth every 6 (six) hours as needed for headache, mild pain or moderate pain.    [provider]  lidocaine (LIDODERM) 5 % Place 1 patch onto the skin daily. Remove & Discard patch within 12 hours or as directed by MD Patient not taking: Reported on 10/25/2020 10/11/20   Palumbo, April, MD  ondansetron (ZOFRAN) 4 MG tablet Take 1 tablet (4 mg total) by mouth every 6 (six) hours as needed for nausea. 10/01/20   Alwyn Ren, MD  oxyCODONE-acetaminophen (PERCOCET/ROXICET) 5-325 MG tablet Take 1 tablet by mouth every 8 (eight) hours as needed for severe pain. 10/25/20   Wynetta Fines, MD  polyethylene glycol (MIRALAX / GLYCOLAX) 17 g packet Take 17 g by mouth daily. Patient not taking: Reported on 10/25/2020 10/01/20   Alwyn Ren, MD  tiZANidine (ZANAFLEX) 2 MG tablet Take 2 mg by mouth daily.    [provider]  tiZANidine (ZANAFLEX) 4 MG tablet Take 4 mg by mouth at bedtime. 09/05/20   [provider]  vitamin C (ASCORBIC ACID) 250 MG tablet Take 2 tablets (500 mg total) by mouth 2 (two) times daily. Patient not taking: No sig reported 10/01/20   Alwyn Ren, MD    Allergies    Silver  Review of Systems   Review of Systems  All other systems reviewed and are negative.   Physical Exam Updated Vital Signs BP (!) 147/84   Pulse 77   Temp 98.9 F (37.2 C)   Resp 18   Ht 1.702 m (5\' 7" )   Wt 128 kg   SpO2 100%   BMI 44.20 kg/m   Physical Exam Vitals and nursing note reviewed.  Constitutional:      General: She is not in acute distress.    Appearance: Normal appearance. She is  well-developed. She is not toxic-appearing.  HENT:     Head: Normocephalic and atraumatic.  Eyes:     General: Lids are normal.     Conjunctiva/sclera: Conjunctivae normal.     Pupils: Pupils are equal, round, and reactive to light.  Neck:     Thyroid: No thyroid mass.     Trachea: No tracheal deviation.  Cardiovascular:     Rate and Rhythm: Normal rate and regular rhythm.     Heart sounds: Normal heart sounds. No murmur heard. No gallop.   Pulmonary:     Effort: Pulmonary effort is normal. No respiratory distress.     Breath sounds: Normal breath sounds. No stridor. No decreased breath sounds, wheezing, rhonchi or rales.  Abdominal:     General: Bowel sounds are normal. There is no distension.     Palpations: Abdomen is soft.     Tenderness: There is no abdominal tenderness. There is no rebound.  Musculoskeletal:        General: No tenderness. Normal range of motion.     Cervical back: Normal range of motion and neck supple.       Legs:  Skin:    General: Skin is warm and dry.     Findings: No abrasion or rash.  Neurological:     Mental Status: She is alert and oriented to person, place, and time.     GCS: GCS eye subscore is 4. GCS verbal subscore is 5. GCS motor subscore is 6.     Cranial Nerves: No cranial nerve deficit.     Sensory: No sensory deficit.  Psychiatric:        Speech: Speech normal.        Behavior: Behavior normal.     ED Results / Procedures / Treatments   Labs (all labs ordered are listed, but only abnormal results are displayed) Labs Reviewed  CBC WITH DIFFERENTIAL/PLATELET - Abnormal; Notable for the following components:      Result Value   Hemoglobin 9.4 (*)    HCT 31.5 (*)    MCV 75.2 (*)    MCH 22.4 (*)    MCHC 29.8 (*)    RDW 19.8 (*)    Platelets 450 (*)    All other components within normal limits  COMPREHENSIVE METABOLIC PANEL - Abnormal; Notable for the following components:   Potassium 3.2 (*)    Calcium 8.6 (*)    Total  Protein 8.6 (*)    Albumin 2.9 (*)    Total Bilirubin 0.2 (*)    All other components within normal limits  RESP PANEL BY RT-PCR (FLU A&B, COVID) ARPGX2  LACTIC ACID, PLASMA    EKG None  Radiology No results found.  Procedures Procedures   Medications Ordered in ED Medications  0.9 %  sodium chloride infusion (has no administration in time range)  morphine 4 MG/ML injection 8 mg (has no administration in time range)  vancomycin (VANCOCIN) IVPB 1000 mg/200 mL premix (has no administration in time range)    ED Course  I have reviewed the triage vital signs and the nursing notes.  Pertinent labs & imaging results that were available during my care of the patient were reviewed by me and considered in my medical decision making (see chart for details).    MDM Rules/Calculators/A&P                          Patient will be started vancomycin for suspected cellulitis.  Given her temperature of 102 degrees.  Given IV fluids here as well as medication for pain and nausea.  Will be admitted to the hospital service Final Clinical Impression(s) / ED Diagnoses Final diagnoses:  None    Rx / DC Orders ED Discharge Orders    None       Lorre Nick, MD 10/27/20 2033

## 2020-10-27 NOTE — ED Notes (Signed)
Unable to administer vancomycin and LR infusion due to lack of IV pumps and channels in the department. Receiving RN made aware.

## 2020-10-27 NOTE — Progress Notes (Signed)
Pharmacy Antibiotic Note  [name] [name] is a 30 y.o. female admitted on 10/27/2020 with  fever at home up to 102 degrees along with worsening chronic bilateral lower extremity pain.  She has chronic edema with chronic ulcers.  States that the pain at her legs have been worse and not responsive to home medications.   Pharmacy has been consulted for to dose vancomycin and cefepime for cellulitis.  Plan: Vancomycin 2gm IV x 1 then 1500 q12h (AUC 480.6, Scr 0.7) Cefepime 2gm IV q8h Follow renal function, cultures and clinical course   Height: 5\' 7"  (170.2 cm) Weight: 128 kg (282 lb 3 oz) IBW/kg (Calculated) : 61.6  Temp (24hrs), Avg:98.7 F (37.1 C), Min:98.4 F (36.9 C), Max:98.9 F (37.2 C)  Recent Labs  Lab 10/25/20 1455 10/25/20 1710 10/27/20 1933  WBC 8.4  --  10.1  CREATININE 0.51  --  0.59  LATICACIDVEN 0.8 1.0 1.1    Estimated Creatinine Clearance: 140.6 mL/min (by C-G formula based on SCr of 0.59 mg/dL).    Allergies  Allergen Reactions  . Silver Dermatitis    Antimicrobials this admission: 5/18 cefepime >> 5/18 vanc >> Dose adjustments this admission:   Microbiology results:  Thank you for allowing pharmacy to be a part of this patient's care.  6/18 RPh 10/27/2020, 11:36 PM

## 2020-10-27 NOTE — ED Provider Notes (Signed)
Emergency Medicine Provider Triage Evaluation Note  [name] [name] , a 30 y.o. female  was evaluated in triage.  Pt complains of lower extremity pain.  Patient with history of chronic lymphedema, DM type II, venous stasis ulcer, chronic pain presents to the ER with complaints of right lower leg pain.  Reports increasing drainage, pain with putting weight on it.  Was seen on Sunday for the same.  She reports a fever of 102 and some chills.  She also reports poor p.o. intake.  Arrived to the ER moaning and crying in pain  Review of Systems  Positive: As above Negative: As above  Physical Exam  BP 134/82 (BP Location: Left Arm)   Pulse 100   Temp 98.7 F (37.1 C) (Oral)   Resp 16   Ht 5\' 7"  (1.702 m)   Wt 128 kg   SpO2 96%   BMI 44.20 kg/m  Gen:   Awake, no distress   Resp:  Normal effort  MSK:   Moves extremities without difficulty, lower extremities extremely edematous, consistent with lymphedema, mild warmth bilaterally drainage Other:    Medical Decision Making  Medically screening exam initiated at 7:19 PM.  Appropriate orders placed.  Sedlar was informed that the remainder of the evaluation will be completed by another provider, this initial triage assessment does not replace that evaluation, and the importance of remaining in the ED until their evaluation is complete.     [place], PA-C 10/27/20 1921    10/29/20, MD 10/27/20 [phone]

## 2020-10-27 NOTE — ED Triage Notes (Signed)
Per EMS-B/L lower leg wounds-chronic issue-was seen on Sunday for the same

## 2020-10-27 NOTE — H&P (Signed)
History and Physical    Cleon Gustin ZOX:[id number] DOB: 07-Dec-1990 DOA: 10/27/2020  PCP: Pcp, No  Patient coming from: Home.  Chief Complaint: Bilateral leg pain.  HPI: [name] [name] is a 30 y.o. female with history of bilateral lower extremity lymphedema admitted last month for cellulitis of the lower extremity presents to the ER with worsening pain particularly over the right lower extremity with some discharge around the ulceration.  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  Has subjective feeling of fever chills.  Patient states he had a fever of 102 F at home.  ED Course: In the ER patient was started on empiric antibiotics.  Labs are at baseline.  Patient given that had fever with worsening pain admitted for further management.  COVID test was negative.  Review of Systems: As per HPI, rest all negative.   Past Medical History:  Diagnosis Date  . Chronic pain of both lower extremities   . DM (diabetes mellitus) (HCC)   . Lymphedema of both lower extremities   . S/P debridement   . Venous stasis ulcer (HCC)     History reviewed. No pertinent surgical history.   reports that she has never smoked. She has never used smokeless tobacco. She reports previous alcohol use. No history on file for drug use.  Allergies  Allergen Reactions  . Silver Dermatitis    History reviewed. No pertinent family history.  Prior to Admission medications   Medication Sig Start Date End Date Taking? Authorizing Provider  docusate sodium (COLACE) 100 MG capsule Take 100 mg by mouth daily.    [provider]  doxycycline (VIBRAMYCIN) 50 MG capsule Take 2 capsules (100 mg total) by mouth 2 (two) times daily. 10/01/20   Alwyn Ren, MD  ferrous gluconate (FERGON) 324 MG tablet Take 1 tablet (324 mg total) by mouth daily with breakfast. 10/02/20   Alwyn Ren, MD  gabapentin (NEURONTIN) 600 MG tablet Take 600 mg by mouth 3 (three) times  daily. 09/15/20   [provider]  HYDROcodone-acetaminophen (NORCO) 10-325 MG tablet Take 1 tablet by mouth 4 (four) times daily as needed for moderate pain or severe pain. 10/16/20   [provider]  ibuprofen (ADVIL) 200 MG tablet Take 400 mg by mouth every 6 (six) hours as needed for headache, mild pain or moderate pain.    [provider]  lidocaine (LIDODERM) 5 % Place 1 patch onto the skin daily. Remove & Discard patch within 12 hours or as directed by MD 10/11/20   Nicanor Alcon, April, MD  ondansetron (ZOFRAN) 4 MG tablet Take 1 tablet (4 mg total) by mouth every 6 (six) hours as needed for nausea. 10/01/20   Alwyn Ren, MD  oxyCODONE-acetaminophen (PERCOCET/ROXICET) 5-325 MG tablet Take 1 tablet by mouth every 8 (eight) hours as needed for severe pain. 10/25/20   Wynetta Fines, MD  polyethylene glycol (MIRALAX / GLYCOLAX) 17 g packet Take 17 g by mouth daily. 10/01/20   Alwyn Ren, MD  tiZANidine (ZANAFLEX) 2 MG tablet Take 2 mg by mouth daily.    [provider]  tiZANidine (ZANAFLEX) 4 MG tablet Take 4 mg by mouth at bedtime. 09/05/20   [provider]  vitamin C (ASCORBIC ACID) 250 MG tablet Take 2 tablets (500 mg total) by mouth 2 (two) times daily. 10/01/20   Alwyn Ren, MD    Physical Exam: Constitutional: Moderately built and nourished. Vitals:   10/27/20 1818 10/27/20 1911 10/27/20  1945 10/27/20 2221  BP: 134/82  (!) 147/84 (!) 151/101  Pulse: 100  77 (!) 107  Resp: 16  18 20   Temp: 98.7 F (37.1 C)  98.9 F (37.2 C) 98.4 F (36.9 C)  TempSrc: Oral   Oral  SpO2: 96%  100% 100%  Weight:  128 kg    Height:  5\' 7"  (1.702 m)     Eyes: Anicteric no pallor. ENMT: No discharge from the ears eyes no prolonged. Neck: No mass felt.  No neck rigidity. Respiratory: No rhonchi or crepitations. Cardiovascular: S1-S2 heard. Abdomen: Soft nontender bowel sounds present. Musculoskeletal: Bilateral lower extremity edema  with ulceration involving the posterior aspect of both legs.  Discolored skin.  No active discharge from the ulcerations. Skin: Bilateral lower extremity ulcerations involving the posterior part of the leg with discolored skin. Neurologic: Alert awake oriented to time place and person.  Moving all extremities. Psychiatric: Appears normal.  Normal affect.   Labs on Admission: I have personally reviewed following labs and imaging studies  CBC: Recent Labs  Lab 10/25/20 1455 10/27/20 1933  WBC 8.4 10.1  NEUTROABS 5.8 7.4  HGB 9.1* 9.4*  HCT 31.1* 31.5*  MCV 74.8* 75.2*  PLT 485* 450*   Basic Metabolic Panel: Recent Labs  Lab 10/25/20 1455 10/27/20 1933  NA 138 138  K 3.2* 3.2*  CL 103 103  CO2 27 28  GLUCOSE 96 98  BUN 11 9  CREATININE 0.51 0.59  CALCIUM 8.8* 8.6*   GFR: Estimated Creatinine Clearance: 140.6 mL/min (by C-G formula based on SCr of 0.59 mg/dL). Liver Function Tests: Recent Labs  Lab 10/25/20 1455 10/27/20 1933  AST 15 17  ALT 12 12  ALKPHOS 131* 109  BILITOT 0.2* 0.2*  PROT 8.9* 8.6*  ALBUMIN 3.0* 2.9*   No results for input(s): LIPASE, AMYLASE in the last 168 hours. No results for input(s): AMMONIA in the last 168 hours. Coagulation Profile: No results for input(s): INR, PROTIME in the last 168 hours. Cardiac Enzymes: No results for input(s): CKTOTAL, CKMB, CKMBINDEX, TROPONINI in the last 168 hours. BNP (last 3 results) No results for input(s): PROBNP in the last 8760 hours. HbA1C: No results for input(s): HGBA1C in the last 72 hours. CBG: No results for input(s): GLUCAP in the last 168 hours. Lipid Profile: No results for input(s): CHOL, HDL, LDLCALC, TRIG, CHOLHDL, LDLDIRECT in the last 72 hours. Thyroid Function Tests: No results for input(s): TSH, T4TOTAL, FREET4, T3FREE, THYROIDAB in the last 72 hours. Anemia Panel: No results for input(s): VITAMINB12, FOLATE, FERRITIN, TIBC, IRON, RETICCTPCT in the last 72 hours. Urine  analysis: No results found for: COLORURINE, APPEARANCEUR, LABSPEC, PHURINE, GLUCOSEU, HGBUR, BILIRUBINUR, KETONESUR, PROTEINUR, UROBILINOGEN, NITRITE, LEUKOCYTESUR Sepsis Labs: @LABRCNTIP (procalcitonin:4,lacticidven:4) ) Recent Results (from the past 240 hour(s))  Resp Panel by RT-PCR (Flu A&B, Covid) Nasopharyngeal Swab     Status: None   Collection Time: 10/27/20  8:28 PM   Specimen: Nasopharyngeal Swab; Nasopharyngeal(NP) swabs in vial transport medium  Result Value Ref Range Status   SARS Coronavirus 2 by RT PCR NEGATIVE NEGATIVE Final    Comment: (NOTE) SARS-CoV-2 target nucleic acids are NOT DETECTED.  The SARS-CoV-2 RNA is generally detectable in upper respiratory specimens during the acute phase of infection. The lowest concentration of SARS-CoV-2 viral copies this assay can detect is 138 copies/mL. A negative result does not preclude SARS-Cov-2 infection and should not be used as the sole basis for treatment or other patient management decisions. A negative result may occur  with  improper specimen collection/handling, submission of specimen other than nasopharyngeal swab, presence of viral mutation(s) within the areas targeted by this assay, and inadequate number of viral copies(<138 copies/mL). A negative result must be combined with clinical observations, patient history, and epidemiological information. The expected result is Negative.  Fact Sheet for Patients:  BloggerCourse.com  Fact Sheet for Healthcare Providers:  SeriousBroker.it  This test is no t yet approved or cleared by the [place] FDA and  has been authorized for detection and/or diagnosis of SARS-CoV-2 by FDA under an Emergency Use Authorization (EUA). This EUA will remain  in effect (meaning this test can be used) for the duration of the COVID-19 declaration under Section 564(b)(1) of the Act, 21 U.S.C.section 360bbb-3(b)(1), unless the authorization  is terminated  or revoked sooner.       Influenza A by PCR NEGATIVE NEGATIVE Final   Influenza B by PCR NEGATIVE NEGATIVE Final    Comment: (NOTE) The Xpert Xpress SARS-CoV-2/FLU/RSV plus assay is intended as an aid in the diagnosis of influenza from Nasopharyngeal swab specimens and should not be used as a sole basis for treatment. Nasal washings and aspirates are unacceptable for Xpert Xpress SARS-CoV-2/FLU/RSV testing.  Fact Sheet for Patients: BloggerCourse.com  Fact Sheet for Healthcare Providers: SeriousBroker.it  This test is not yet approved or cleared by the [place] FDA and has been authorized for detection and/or diagnosis of SARS-CoV-2 by FDA under an Emergency Use Authorization (EUA). This EUA will remain in effect (meaning this test can be used) for the duration of the COVID-19 declaration under Section 564(b)(1) of the Act, 21 U.S.C. section 360bbb-3(b)(1), unless the authorization is terminated or revoked.  Performed at [facility], [address] [place], [place] [address]      Radiological Exams on Admission: No results found.    Assessment/Plan Principal Problem:   Cellulitis, leg Active Problems:   Cellulitis   Chronic pain syndrome   Obesity, Class III, BMI 40-49.9 (morbid obesity) (HCC)    1. Bilateral lower extremity lymphedema with possible cellulitis with chronic ulceration actively on the right lower extremity.  Since patient is noting a significant pain in the right lower extremity I have ordered a CT of the right lower extremity.  We will keep patient on empiric antibiotics pending wound consult. 2. Chronic anemia hemoglobin appears to be at baseline.   DVT prophylaxis: Lovenox. Code Status: Full code. Family Communication: Discussed with patient. Disposition Plan: Home. Consults called: Wound team. Admission status: Observation.   Eduard Clos  MD [facility] Pager [phone].  If 7PM-7AM, please contact night-coverage [email or web address] Password [facility]  10/27/2020, 10:22 PM

## 2020-10-28 ENCOUNTER — Observation Stay (HOSPITAL_COMMUNITY): Payer: Medicaid Other

## 2020-10-28 DIAGNOSIS — L03115 Cellulitis of right lower limb: Secondary | ICD-10-CM | POA: Diagnosis present

## 2020-10-28 DIAGNOSIS — L97929 Non-pressure chronic ulcer of unspecified part of left lower leg with unspecified severity: Secondary | ICD-10-CM | POA: Diagnosis present

## 2020-10-28 DIAGNOSIS — Z79899 Other long term (current) drug therapy: Secondary | ICD-10-CM | POA: Diagnosis not present

## 2020-10-28 DIAGNOSIS — G894 Chronic pain syndrome: Secondary | ICD-10-CM | POA: Diagnosis present

## 2020-10-28 DIAGNOSIS — L03116 Cellulitis of left lower limb: Secondary | ICD-10-CM | POA: Diagnosis present

## 2020-10-28 DIAGNOSIS — E11622 Type 2 diabetes mellitus with other skin ulcer: Secondary | ICD-10-CM | POA: Diagnosis present

## 2020-10-28 DIAGNOSIS — L03119 Cellulitis of unspecified part of limb: Secondary | ICD-10-CM | POA: Diagnosis present

## 2020-10-28 DIAGNOSIS — L97919 Non-pressure chronic ulcer of unspecified part of right lower leg with unspecified severity: Secondary | ICD-10-CM | POA: Diagnosis present

## 2020-10-28 DIAGNOSIS — D649 Anemia, unspecified: Secondary | ICD-10-CM | POA: Diagnosis present

## 2020-10-28 DIAGNOSIS — Z6841 Body Mass Index (BMI) 40.0 and over, adult: Secondary | ICD-10-CM | POA: Diagnosis not present

## 2020-10-28 DIAGNOSIS — Z20822 Contact with and (suspected) exposure to covid-19: Secondary | ICD-10-CM | POA: Diagnosis present

## 2020-10-28 DIAGNOSIS — I890 Lymphedema, not elsewhere classified: Secondary | ICD-10-CM | POA: Diagnosis present

## 2020-10-28 DIAGNOSIS — Z91048 Other nonmedicinal substance allergy status: Secondary | ICD-10-CM | POA: Diagnosis not present

## 2020-10-28 LAB — CBC
HCT: 30.2 % — ABNORMAL LOW (ref 36.0–46.0)
Hemoglobin: 8.8 g/dL — ABNORMAL LOW (ref 12.0–15.0)
MCH: 22 pg — ABNORMAL LOW (ref 26.0–34.0)
MCHC: 29.1 g/dL — ABNORMAL LOW (ref 30.0–36.0)
MCV: 75.5 fL — ABNORMAL LOW (ref 80.0–100.0)
Platelets: 421 10*3/uL — ABNORMAL HIGH (ref 150–400)
RBC: 4 MIL/uL (ref 3.87–5.11)
RDW: 19.7 % — ABNORMAL HIGH (ref 11.5–15.5)
WBC: 10.9 10*3/uL — ABNORMAL HIGH (ref 4.0–10.5)
nRBC: 0 % (ref 0.0–0.2)

## 2020-10-28 LAB — CREATININE, SERUM
Creatinine, Ser: 0.51 mg/dL (ref 0.44–1.00)
GFR, Estimated: >60 mL/min (ref >60)

## 2020-10-28 LAB — HIV ANTIBODY (ROUTINE TESTING W REFLEX): HIV Screen 4th Generation wRfx: NONREACTIVE

## 2020-10-28 MED ORDER — POTASSIUM CHLORIDE CRYS ER 20 MEQ PO TBCR
40.0000 meq | EXTENDED_RELEASE_TABLET | Freq: Once | ORAL | Status: AC
  Administered 2020-10-28: 40 meq via ORAL
  Filled 2020-10-28: qty 2

## 2020-10-28 MED ORDER — HYDROMORPHONE HCL 1 MG/ML IJ SOLN
1.0000 mg | INTRAMUSCULAR | Status: DC | PRN
  Administered 2020-10-28 – 2020-10-29 (×5): 1 mg via INTRAVENOUS
  Filled 2020-10-28 (×5): qty 1

## 2020-10-28 MED ORDER — DIPHENHYDRAMINE HCL 25 MG PO CAPS
25.0000 mg | ORAL_CAPSULE | Freq: Four times a day (QID) | ORAL | Status: DC | PRN
  Administered 2020-10-28: 25 mg via ORAL
  Filled 2020-10-28: qty 1

## 2020-10-28 MED ORDER — METHOCARBAMOL 500 MG PO TABS
750.0000 mg | ORAL_TABLET | Freq: Four times a day (QID) | ORAL | Status: DC | PRN
  Administered 2020-10-28 – 2020-10-29 (×3): 750 mg via ORAL
  Filled 2020-10-28 (×3): qty 2

## 2020-10-28 MED ORDER — HYDROMORPHONE HCL 1 MG/ML IJ SOLN
1.0000 mg | INTRAMUSCULAR | Status: DC | PRN
  Administered 2020-10-28 (×3): 1 mg via INTRAVENOUS
  Filled 2020-10-28 (×3): qty 1

## 2020-10-28 MED ORDER — VANCOMYCIN HCL 1500 MG/300ML IV SOLN
1500.0000 mg | Freq: Two times a day (BID) | INTRAVENOUS | Status: DC
  Administered 2020-10-28: 1500 mg via INTRAVENOUS
  Filled 2020-10-28: qty 300

## 2020-10-28 NOTE — Progress Notes (Signed)
PROGRESS NOTE    [name] [name]  ZOX:[id number] DOB: 10/29/1990 DOA: 10/27/2020 PCP: Pcp, No   Brief Narrative: 30 year old female with history of chronic bilateral lower extremity lymphedema recently discharged from the hospital comes back with similar complaints of lower extremity pain and discomfort.  Essentially all the wounds in her lower extremities have been clean with no evidence of purulent drainage and she has been afebrile since admission.  Assessment & Plan:   Principal Problem:   Cellulitis, leg Active Problems:   Cellulitis   Chronic pain syndrome   Obesity, Class III, BMI 40-49.9 (morbid obesity) (HCC)  #1 bilateral lower extremity lymphedema with chronic ulceration in both lower extremities over 3 years. Taking IV narcotics for pain control. I will stop IV antibiotics as there is no evidence of cellulitis or wound infection, white count normal.  She was also seen by wound care who is recommending the following treatments.  Cleanse wounds with water (not normal saline). Pat surrounding skin dry.  Apply calcium alginate dressings to wound bed [place] # [phone]), moistened slightly with sterile water (not normal saline) using a large syringe once dressings are in place. Top with opened and fluffed gauze to reach skin level.  Top with ABD pads, secure with Kerlix roll gauze applied from just below toes to just below knee/paper tape. Change daily and PRN drainage strike-through. Elevate LEs while in bed and chair.   She will need to follow-up consistently at 1 hospital.  [facility] is an [facility] patient at [facility]  Estimated body mass index is 44.2 kg/m as calculated from the following:   Height as of this encounter: 5\' 7"  (1.702 m).   Weight as of this encounter: 128 kg.  DVT prophylaxis: Lovenox  code Status: Full code Family Communication: None at bedside Disposition Plan:  Status is: Observation  Dispo: The patient is from: Home              Anticipated d/c  is to: Home              Patient currently is not medically stable to d/c.   Difficult to place patient Yes   Consultants: None  Procedures: None Antimicrobials: None  Subjective:  Resting in bed sleeping on her belly Objective: Vitals:   10/27/20 2221 10/28/20 0102 10/28/20 0554 10/28/20 0930  BP: (!) 151/101 (!) 128/91 133/78 (!) 108/53  Pulse: (!) 107 (!) 110 99 84  Resp: 20 18 18 18   Temp: 98.4 F (36.9 C) 98.2 F (36.8 C) 98.6 F (37 C)   TempSrc: Oral Oral Oral   SpO2: 100% 97% 100% 100%  Weight:      Height:        Intake/Output Summary (Last 24 hours) at 10/28/2020 1243 Last data filed at 10/28/2020 1000 Gross per 24 hour  Intake 1151.58 ml  Output 900 ml  Net 251.58 ml   Filed Weights   10/27/20 1911  Weight: 128 kg    Examination:  General exam: Appears calm and comfortable  Respiratory system: Clear to auscultation. Respiratory effort normal. Cardiovascular system: S1 & S2 heard, RRR. No JVD, murmurs, rubs, gallops or clicks. No pedal edema. Gastrointestinal system: Abdomen is nondistended, soft and nontender. No organomegaly or masses felt. Normal bowel sounds heard. Central nervous system: Alert and oriented. No focal neurological deficits. Extremities: Multiple open wounds in both lower extremities on the posterior aspect Skin: No rashes, lesions or ulcers Psychiatry: Judgement and insight appear normal. Mood & affect appropriate.  Data Reviewed: I have personally reviewed following labs and imaging studies  CBC: Recent Labs  Lab 10/25/20 1455 10/27/20 1933 10/28/20 0426  WBC 8.4 10.1 10.9*  NEUTROABS 5.8 7.4  --   HGB 9.1* 9.4* 8.8*  HCT 31.1* 31.5* 30.2*  MCV 74.8* 75.2* 75.5*  PLT 485* 450* 421*   Basic Metabolic Panel: Recent Labs  Lab 10/25/20 1455 10/27/20 1933 10/28/20 0426  NA 138 138  --   K 3.2* 3.2*  --   CL 103 103  --   CO2 27 28  --   GLUCOSE 96 98  --   BUN 11 9  --   CREATININE 0.51 0.59 0.51  CALCIUM 8.8*  8.6*  --    GFR: Estimated Creatinine Clearance: 140.6 mL/min (by C-G formula based on SCr of 0.51 mg/dL). Liver Function Tests: Recent Labs  Lab 10/25/20 1455 10/27/20 1933  AST 15 17  ALT 12 12  ALKPHOS 131* 109  BILITOT 0.2* 0.2*  PROT 8.9* 8.6*  ALBUMIN 3.0* 2.9*   No results for input(s): LIPASE, AMYLASE in the last 168 hours. No results for input(s): AMMONIA in the last 168 hours. Coagulation Profile: No results for input(s): INR, PROTIME in the last 168 hours. Cardiac Enzymes: No results for input(s): CKTOTAL, CKMB, CKMBINDEX, TROPONINI in the last 168 hours. BNP (last 3 results) No results for input(s): PROBNP in the last 8760 hours. HbA1C: No results for input(s): HGBA1C in the last 72 hours. CBG: No results for input(s): GLUCAP in the last 168 hours. Lipid Profile: No results for input(s): CHOL, HDL, LDLCALC, TRIG, CHOLHDL, LDLDIRECT in the last 72 hours. Thyroid Function Tests: No results for input(s): TSH, T4TOTAL, FREET4, T3FREE, THYROIDAB in the last 72 hours. Anemia Panel: No results for input(s): VITAMINB12, FOLATE, FERRITIN, TIBC, IRON, RETICCTPCT in the last 72 hours. Sepsis Labs: Recent Labs  Lab 10/25/20 1455 10/25/20 1710 10/27/20 1933  LATICACIDVEN 0.8 1.0 1.1    Recent Results (from the past 240 hour(s))  Resp Panel by RT-PCR (Flu A&B, Covid) Nasopharyngeal Swab     Status: None   Collection Time: 10/27/20  8:28 PM   Specimen: Nasopharyngeal Swab; Nasopharyngeal(NP) swabs in vial transport medium  Result Value Ref Range Status   SARS Coronavirus 2 by RT PCR NEGATIVE NEGATIVE Final    Comment: (NOTE) SARS-CoV-2 target nucleic acids are NOT DETECTED.  The SARS-CoV-2 RNA is generally detectable in upper respiratory specimens during the acute phase of infection. The lowest concentration of SARS-CoV-2 viral copies this assay can detect is 138 copies/mL. A negative result does not preclude SARS-Cov-2 infection and should not be used as the sole  basis for treatment or other patient management decisions. A negative result may occur with  improper specimen collection/handling, submission of specimen other than nasopharyngeal swab, presence of viral mutation(s) within the areas targeted by this assay, and inadequate number of viral copies(<138 copies/mL). A negative result must be combined with clinical observations, patient history, and epidemiological information. The expected result is Negative.  Fact Sheet for Patients:  BloggerCourse.com  Fact Sheet for Healthcare Providers:  SeriousBroker.it  This test is no t yet approved or cleared by the [place] FDA and  has been authorized for detection and/or diagnosis of SARS-CoV-2 by FDA under an Emergency Use Authorization (EUA). This EUA will remain  in effect (meaning this test can be used) for the duration of the COVID-19 declaration under Section 564(b)(1) of the Act, 21 U.S.C.section 360bbb-3(b)(1), unless the authorization is terminated  or revoked sooner.       Influenza A by PCR NEGATIVE NEGATIVE Final   Influenza B by PCR NEGATIVE NEGATIVE Final    Comment: (NOTE) The Xpert Xpress SARS-CoV-2/FLU/RSV plus assay is intended as an aid in the diagnosis of influenza from Nasopharyngeal swab specimens and should not be used as a sole basis for treatment. Nasal washings and aspirates are unacceptable for Xpert Xpress SARS-CoV-2/FLU/RSV testing.  Fact Sheet for Patients: BloggerCourse.comhttps://www.fda.gov/media/152166/download  Fact Sheet for Healthcare Providers: SeriousBroker.ithttps://www.fda.gov/media/152162/download  This test is not yet approved or cleared by the [place] FDA and has been authorized for detection and/or diagnosis of SARS-CoV-2 by FDA under an Emergency Use Authorization (EUA). This EUA will remain in effect (meaning this test can be used) for the duration of the COVID-19 declaration under Section 564(b)(1) of the Act,  21 U.S.C. section 360bbb-3(b)(1), unless the authorization is terminated or revoked.  Performed at [facility], [address] [place], [place] [address]          Radiology Studies: CT TIBIA FIBULA RIGHT WO CONTRAST  Result Date: 10/28/2020 CLINICAL DATA:  Chronic right lower extremity ulceration and edema. Fever. EXAM: CT OF THE LOWER RIGHT EXTREMITY WITHOUT CONTRAST TECHNIQUE: Multidetector CT imaging of the right lower extremity was performed according to the standard protocol. COMPARISON:  Plain radiograph 10/14/2020 FINDINGS: Bones/Joint/Cartilage There is no acute fracture or dislocation of the bones of the right foreleg. 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 No cortical erosion or abnormal periosteal reaction. Ligaments Suboptimally assessed by CT. Muscles and Tendons Patellar tendon is intact.  Normal muscle bulk. Soft tissues There is extensive subcutaneous edema throughout the visualized right lower extremity. There is extensive dermal thickening noted, particularly peripherally. These changes can be seen the setting of bland edema or inflammation as can be seen with venous stasis or cellulitis, respectively. No discrete drainable fluid collection identified. Shallow, but extensive soft tissue ulcers are noted along the posterior soft tissues at the level of the mid to distal diaphysis of the tibia and fibula. These ulcerations do not extend through the abundant subcutaneous fat to reach the subjacent muscular compartments or osseous structures. Scattered calcifications within the subcutaneous fat anteriorly are nonspecific and may represent the sequela of remote trauma or inflammation. IMPRESSION: Multiple relatively shallow though broad ulcerations involving the posterior soft tissues limited to the subcutaneous fat.  Extensive subcutaneous edema throughout the visualized right lower extremity, possibly representing bland edema as can be seen with venous stasis or inflammatory change as can be seen with cellulitis. No evidence of osteomyelitis. Electronically Signed   By: Helyn NumbersAshesh  Parikh MD   On: 10/28/2020 02:36        Scheduled Meds: . vitamin C  500 mg Oral BID  . docusate sodium  100 mg Oral Daily  . enoxaparin (LOVENOX) injection  60 mg Subcutaneous Q24H  . ferrous gluconate  324 mg Oral Q breakfast  . gabapentin  600 mg Oral TID  . lidocaine  1 patch Transdermal Q24H  . polyethylene glycol  17 g Oral Daily  . tiZANidine  2 mg Oral Daily  . tiZANidine  4 mg Oral QHS   Continuous Infusions: . ceFEPime (MAXIPIME) IV 2 g (10/28/20 0858)  . lactated ringers 125 mL/hr at 10/28/20 1219  . vancomycin 1,500 mg (10/28/20 1217)     LOS: 0 days    Alwyn RenElizabeth G Lemario Chaikin,  MD 10/28/2020, 12:43 PM

## 2020-10-28 NOTE — Consult Note (Addendum)
WOC Nurse Consult Note: Reason for Consult: Bilateral LE wounds in the presence of lymphedema. Discussed POC and recommendations for post- acute management with Dr. Delight Hoh. Wound type:Venous insufficiency, lymphedema Pressure Injury POA: N/A Measurement: Per Nursing Flow Sheets Wound bed:100% clean, dry, nongranulating,  Drainage (amount, consistency, odor) Small serous to light yellow Periwound: edema Dressing procedure/placement/frequency: Patient with a silver allergy; I will implement daily dressing changes with a calcium alginate dressing moistened slightly with sterile water (not normal saline) after applying to wound bed. See Nursing Orders provided below:  Nursing Orders for Wound Care: Cleanse wounds with water (not normal saline). Pat surrounding skin dry.  Apply calcium alginate dressings to wound bed [place] # [phone]), moistened slightly with sterile water (not normal saline) using a large syringe once dressings are in place. Top with opened and fluffed gauze to reach skin level.  Top with ABD pads, secure with Kerlix roll gauze applied from just below toes to just below knee/paper tape. Change daily and PRN drainage strike-through. Elevate LEs while in bed and chair.  It is recommended that the patient follow up with the outpatient [facility] as she is an established patient in that clinic.   WOC nursing team will not follow, but will remain available to this patient, the nursing and medical teams.  Please re-consult if needed. Thanks, Ladona Mow, MSN, RN, GNP, [facility] [place]  Pager# [phone]

## 2020-10-28 NOTE — Progress Notes (Signed)
Placed order for new IV start due to current IV not running. I have attempted multiple times to use this IV for fluids and antibiotics with no success. One IV attempted and failed due to patient being a difficult stick.  Patient did mention that she usually has to have the ultrasound used during IV start. Patient is not able to currently get IV fluid, antibiotics, or IV pain meds without IV. Order changed to STAT, waiting for IV team. [name] [name]

## 2020-10-28 NOTE — Progress Notes (Signed)
Attempted to start new IV for patient in severe pain due to cellulitis. Only palpated one vein on lateral right AC. No blood return with attempt. Patient will need IV team with ultrasound to attempt IV placement.

## 2020-10-29 DIAGNOSIS — L03119 Cellulitis of unspecified part of limb: Secondary | ICD-10-CM | POA: Diagnosis not present

## 2020-10-29 MED ORDER — OXYCODONE-ACETAMINOPHEN 5-325 MG PO TABS: 1.0000 | ORAL_TABLET | Freq: Three times a day (TID) | ORAL | 0 refills | Status: DC | PRN

## 2020-10-29 NOTE — Discharge Summary (Signed)
Physician Discharge Summary  [name] [name] UUV:[id number] DOB: Mar 31, 1991 DOA: 10/27/2020  PCP: Pcp, No  Admit date: 10/27/2020 Discharge date: 10/29/2020  Admitted From: Home Disposition: Home  Recommendations for Outpatient Follow-up:  1. Follow up with PCP in 1-2 weeks 2. Please obtain BMP/CBC in one week 3. Please follow up with [facility] wound clinic  Home Health: None Equipment/Devices: None Discharge Condition stable CODE STATUS: Full code Diet recommendation cardiac Brief/Interim Summary: 30 year old female with history of chronic bilateral lower extremity lymphedema recently discharged from the hospital comes back with similar complaints of lower extremity pain and discomfort.  Essentially all the wounds in her lower extremities have been clean with no evidence of purulent drainage and she has been afebrile since admission.   Discharge Diagnoses:  Principal Problem:   Cellulitis, leg Active Problems:   Cellulitis   Chronic pain syndrome   Obesity, Class III, BMI 40-49.9 (morbid obesity) (HCC)   #1 bilateral lower extremity lymphedema with chronic ulceration in both lower extremities over 3 years.  She was given IV narcotics for pain control initially started on empiric antibiotics which was stopped as there was no evidence of infection in the wounds.  She was asked to follow-up with [facility] wound care her primary wound care location.  Cleanse wounds with water (not normal saline). Pat surrounding skin dry. Apply calcium alginate dressings to wound bed [facility] # [phone]), moistened slightly with sterile water (not normal saline) using a large syringe once dressings are in place. Top with opened and fluffed gauze to reach skin level. Top with ABD pads, secure with Kerlix roll gauze applied from just below toes to just below knee/paper tape. Change daily and PRN drainage strike-through. Elevate LEs while in bed and chair. She will need to follow-up consistently at 1  hospital.  She is an established patient at [facility]  Estimated body mass index is 44.2 kg/m as calculated from the following:   Height as of this encounter: 5\' 7"  (1.702 m).   Weight as of this encounter: 128 kg.  Discharge Instructions  Discharge Instructions    Diet - low sodium heart healthy   Complete by: As directed    Discharge wound care:   Complete by: As directed    See orders   Increase activity slowly   Complete by: As directed      Allergies as of 10/29/2020      Reactions   Silver Dermatitis      Medication List    STOP taking these medications   HYDROcodone-acetaminophen 10-325 MG tablet Commonly known as: NORCO     TAKE these medications   docusate sodium 100 MG capsule Commonly known as: COLACE Take 100 mg by mouth daily.   ferrous gluconate 324 MG tablet Commonly known as: FERGON Take 1 tablet (324 mg total) by mouth daily with breakfast.   gabapentin 600 MG tablet Commonly known as: NEURONTIN Take 600 mg by mouth 3 (three) times daily.   ibuprofen 200 MG tablet Commonly known as: ADVIL Take 400 mg by mouth every 6 (six) hours as needed for headache, mild pain or moderate pain.   lidocaine 5 % Commonly known as: Lidoderm Place 1 patch onto the skin daily. Remove & Discard patch within 12 hours or as directed by MD   ondansetron 4 MG tablet Commonly known as: ZOFRAN Take 1 tablet (4 mg total) by mouth every 6 (six) hours as needed for nausea.   oxyCODONE-acetaminophen 5-325 MG tablet Commonly known as: PERCOCET/ROXICET  Take 1 tablet by mouth every 8 (eight) hours as needed for up to 7 days for severe pain.   tiZANidine 4 MG tablet Commonly known as: ZANAFLEX Take 4 mg by mouth at bedtime. What changed: Another medication with the same name was removed. Continue taking this medication, and follow the directions you see here.            Discharge Care Instructions  (From admission, onward)         Start     Ordered    10/29/20 0000  Discharge wound care:       Comments: See orders   10/29/20 0858          Allergies  Allergen Reactions  . Silver Dermatitis    Consultations: none  Procedures/Studies: DG Tibia/Fibula Left  Result Date: 10/14/2020 CLINICAL DATA:  Status post fall with a chronic left leg wound. EXAM: LEFT TIBIA AND FIBULA - 2 VIEW COMPARISON:  None. FINDINGS: There is no evidence of acute fracture or other focal bone lesions. Marked severity diffuse soft tissue swelling is seen secondary to the patient's body habitus. A large chronic, lateral soft tissue defect is seen. This measures approximately 18.7 cm in length. IMPRESSION: Large, chronic lateral left leg wound, without an acute osseous abnormality. Electronically Signed   By: Aram Candela M.D.   On: 10/14/2020 03:36   CT TIBIA FIBULA RIGHT WO CONTRAST  Result Date: 10/28/2020 CLINICAL DATA:  Chronic right lower extremity ulceration and edema. Fever. EXAM: CT OF THE LOWER RIGHT EXTREMITY WITHOUT CONTRAST TECHNIQUE: Multidetector CT imaging of the right lower extremity was performed according to the standard protocol. COMPARISON:  Plain radiograph 10/14/2020 FINDINGS: Bones/Joint/Cartilage There is no acute fracture or dislocation of the bones of the right foreleg. 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 No cortical erosion or abnormal periosteal reaction. Ligaments Suboptimally assessed by CT. Muscles and Tendons Patellar tendon is intact.  Normal muscle bulk. Soft tissues There is extensive subcutaneous edema throughout the visualized right lower extremity. There is extensive dermal thickening noted, particularly peripherally. These changes can be seen the setting of bland edema or inflammation as can be seen with venous stasis or cellulitis, respectively. No discrete drainable fluid collection  identified. Shallow, but extensive soft tissue ulcers are noted along the posterior soft tissues at the level of the mid to distal diaphysis of the tibia and fibula. These ulcerations do not extend through the abundant subcutaneous fat to reach the subjacent muscular compartments or osseous structures. Scattered calcifications within the subcutaneous fat anteriorly are nonspecific and may represent the sequela of remote trauma or inflammation. IMPRESSION: Multiple relatively shallow though broad ulcerations involving the posterior soft tissues limited to the subcutaneous fat. Extensive subcutaneous edema throughout the visualized right lower extremity, possibly representing bland edema as can be seen with venous stasis or inflammatory change as can be seen with cellulitis. No evidence of osteomyelitis. Electronically Signed   By: Helyn Numbers MD   On: 10/28/2020 02:36    (Echo, Carotid, EGD, Colonoscopy, ERCP)    Subjective: Patient sitting up by the side of the bed  Discharge Exam: Vitals:   10/28/20 1338 10/29/20 0144  BP: 121/75 (!) 93/58  Pulse: 79 88  Resp: 18 18  Temp: 98.2 F (36.8 C) (!) 97.5 F (36.4 C)  SpO2: 100% 97%   Vitals:   10/28/20 0554 10/28/20 0930 10/28/20 1338 10/29/20  0144  BP: 133/78 (!) 108/53 121/75 (!) 93/58  Pulse: 99 84 79 88  Resp: 18 18 18 18   Temp: 98.6 F (37 C)  98.2 F (36.8 C) (!) 97.5 F (36.4 C)  TempSrc: Oral     SpO2: 100% 100% 100% 97%  Weight:      Height:        General: Pt is alert, awake, not in acute distress Cardiovascular: RRR, S1/S2 +, no rubs, no gallops Respiratory: CTA bilaterally, no wheezing, no rhonchi Abdominal: Soft, NT, ND, bowel sounds + Extremities: no edema, no cyanosis    The results of significant diagnostics from this hospitalization (including imaging, microbiology, ancillary and laboratory) are listed below for reference.     Microbiology: Recent Results (from the past 240 hour(s))  Resp Panel by RT-PCR  (Flu A&B, Covid) Nasopharyngeal Swab     Status: None   Collection Time: 10/27/20  8:28 PM   Specimen: Nasopharyngeal Swab; Nasopharyngeal(NP) swabs in vial transport medium  Result Value Ref Range Status   SARS Coronavirus 2 by RT PCR NEGATIVE NEGATIVE Final    Comment: (NOTE) SARS-CoV-2 target nucleic acids are NOT DETECTED.  The SARS-CoV-2 RNA is generally detectable in upper respiratory specimens during the acute phase of infection. The lowest concentration of SARS-CoV-2 viral copies this assay can detect is 138 copies/mL. A negative result does not preclude SARS-Cov-2 infection and should not be used as the sole basis for treatment or other patient management decisions. A negative result may occur with  improper specimen collection/handling, submission of specimen other than nasopharyngeal swab, presence of viral mutation(s) within the areas targeted by this assay, and inadequate number of viral copies(<138 copies/mL). A negative result must be combined with clinical observations, patient history, and epidemiological information. The expected result is Negative.  Fact Sheet for Patients:  BloggerCourse.comhttps://www.fda.gov/media/152166/download  Fact Sheet for Healthcare Providers:  SeriousBroker.ithttps://www.fda.gov/media/152162/download  This test is no t yet approved or cleared by the [place] FDA and  has been authorized for detection and/or diagnosis of SARS-CoV-2 by FDA under an Emergency Use Authorization (EUA). This EUA will remain  in effect (meaning this test can be used) for the duration of the COVID-19 declaration under Section 564(b)(1) of the Act, 21 U.S.C.section 360bbb-3(b)(1), unless the authorization is terminated  or revoked sooner.       Influenza A by PCR NEGATIVE NEGATIVE Final   Influenza B by PCR NEGATIVE NEGATIVE Final    Comment: (NOTE) The Xpert Xpress SARS-CoV-2/FLU/RSV plus assay is intended as an aid in the diagnosis of influenza from Nasopharyngeal swab specimens  and should not be used as a sole basis for treatment. Nasal washings and aspirates are unacceptable for Xpert Xpress SARS-CoV-2/FLU/RSV testing.  Fact Sheet for Patients: BloggerCourse.comhttps://www.fda.gov/media/152166/download  Fact Sheet for Healthcare Providers: SeriousBroker.ithttps://www.fda.gov/media/152162/download  This test is not yet approved or cleared by the [place] FDA and has been authorized for detection and/or diagnosis of SARS-CoV-2 by FDA under an Emergency Use Authorization (EUA). This EUA will remain in effect (meaning this test can be used) for the duration of the COVID-19 declaration under Section 564(b)(1) of the Act, 21 U.S.C. section 360bbb-3(b)(1), unless the authorization is terminated or revoked.  Performed at [facility], [address] [place], [place] [id number]      Labs: BNP (last 3 results) No results for input(s): BNP in the last 8760 hours. Basic Metabolic Panel: Recent Labs  Lab 10/25/20 1455 10/27/20 1933 10/28/20 0426  NA 138 138  --  K 3.2* 3.2*  --   CL 103 103  --   CO2 27 28  --   GLUCOSE 96 98  --   BUN 11 9  --   CREATININE 0.51 0.59 0.51  CALCIUM 8.8* 8.6*  --    Liver Function Tests: Recent Labs  Lab 10/25/20 1455 10/27/20 1933  AST 15 17  ALT 12 12  ALKPHOS 131* 109  BILITOT 0.2* 0.2*  PROT 8.9* 8.6*  ALBUMIN 3.0* 2.9*   No results for input(s): LIPASE, AMYLASE in the last 168 hours. No results for input(s): AMMONIA in the last 168 hours. CBC: Recent Labs  Lab 10/25/20 1455 10/27/20 1933 10/28/20 0426  WBC 8.4 10.1 10.9*  NEUTROABS 5.8 7.4  --   HGB 9.1* 9.4* 8.8*  HCT 31.1* 31.5* 30.2*  MCV 74.8* 75.2* 75.5*  PLT 485* 450* 421*   Cardiac Enzymes: No results for input(s): CKTOTAL, CKMB, CKMBINDEX, TROPONINI in the last 168 hours. BNP: Invalid input(s): POCBNP CBG: No results for input(s): GLUCAP in the last 168 hours. D-Dimer No results for input(s): DDIMER in the last 72 hours. Hgb A1c No  results for input(s): HGBA1C in the last 72 hours. Lipid Profile No results for input(s): CHOL, HDL, LDLCALC, TRIG, CHOLHDL, LDLDIRECT in the last 72 hours. Thyroid function studies No results for input(s): TSH, T4TOTAL, T3FREE, THYROIDAB in the last 72 hours.  Invalid input(s): FREET3 Anemia work up No results for input(s): VITAMINB12, FOLATE, FERRITIN, TIBC, IRON, RETICCTPCT in the last 72 hours. Urinalysis No results found for: COLORURINE, APPEARANCEUR, LABSPEC, PHURINE, GLUCOSEU, HGBUR, BILIRUBINUR, KETONESUR, PROTEINUR, UROBILINOGEN, NITRITE, LEUKOCYTESUR Sepsis Labs Invalid input(s): PROCALCITONIN,  WBC,  LACTICIDVEN Microbiology Recent Results (from the past 240 hour(s))  Resp Panel by RT-PCR (Flu A&B, Covid) Nasopharyngeal Swab     Status: None   Collection Time: 10/27/20  8:28 PM   Specimen: Nasopharyngeal Swab; Nasopharyngeal(NP) swabs in vial transport medium  Result Value Ref Range Status   SARS Coronavirus 2 by RT PCR NEGATIVE NEGATIVE Final    Comment: (NOTE) SARS-CoV-2 target nucleic acids are NOT DETECTED.  The SARS-CoV-2 RNA is generally detectable in upper respiratory specimens during the acute phase of infection. The lowest concentration of SARS-CoV-2 viral copies this assay can detect is 138 copies/mL. A negative result does not preclude SARS-Cov-2 infection and should not be used as the sole basis for treatment or other patient management decisions. A negative result may occur with  improper specimen collection/handling, submission of specimen other than nasopharyngeal swab, presence of viral mutation(s) within the areas targeted by this assay, and inadequate number of viral copies(<138 copies/mL). A negative result must be combined with clinical observations, patient history, and epidemiological information. The expected result is Negative.  Fact Sheet for Patients:  BloggerCourse.com  Fact Sheet for Healthcare Providers:   SeriousBroker.it  This test is no t yet approved or cleared by the [place] FDA and  has been authorized for detection and/or diagnosis of SARS-CoV-2 by FDA under an Emergency Use Authorization (EUA). This EUA will remain  in effect (meaning this test can be used) for the duration of the COVID-19 declaration under Section 564(b)(1) of the Act, 21 U.S.C.section 360bbb-3(b)(1), unless the authorization is terminated  or revoked sooner.       Influenza A by PCR NEGATIVE NEGATIVE Final   Influenza B by PCR NEGATIVE NEGATIVE Final    Comment: (NOTE) The Xpert Xpress SARS-CoV-2/FLU/RSV plus assay is intended as an aid in the diagnosis of influenza from Nasopharyngeal  swab specimens and should not be used as a sole basis for treatment. Nasal washings and aspirates are unacceptable for Xpert Xpress SARS-CoV-2/FLU/RSV testing.  Fact Sheet for Patients: BloggerCourse.com  Fact Sheet for Healthcare Providers: SeriousBroker.it  This test is not yet approved or cleared by the [place] FDA and has been authorized for detection and/or diagnosis of SARS-CoV-2 by FDA under an Emergency Use Authorization (EUA). This EUA will remain in effect (meaning this test can be used) for the duration of the COVID-19 declaration under Section 564(b)(1) of the Act, 21 U.S.C. section 360bbb-3(b)(1), unless the authorization is terminated or revoked.  Performed at [facility], [address] [place], [place] [address]      Time coordinating discharge: 39 minutes  SIGNED:   Alwyn Ren, MD  [facility] 10/29/2020, 1:37 PM

## 2020-10-29 NOTE — TOC Transition Note (Signed)
Transition of Care [facility]) - CM/SW Discharge Note  Patient Details  Name: [name] [name] MRN: [id number] Date of Birth: [birth year]  [facility] CM/SW Contact:  Ewing Schlein, LCSW Phone Number: 10/29/2020, 9:53 AM  Clinical Narrative: Patient in need of transportation home as she is ready for discharge. CSW scheduled PTAR and completed medical necessity form. RN updated. TOC signing off.  Final next level of care: Home/Self Care Barriers to Discharge: Barriers Resolved  Patient Goals and CMS Choice Patient states their goals for this hospitalization and ongoing recovery are:: Return home Choice offered to / list presented to : NA  Discharge Plan and Services        DME Arranged: N/A DME Agency: NA  Readmission Risk Interventions No flowsheet data found.

## 2020-10-29 NOTE — Progress Notes (Signed)
Pt alert and oriented. D/cd to home via PTAR.

## 2020-11-01 ENCOUNTER — Emergency Department (HOSPITAL_BASED_OUTPATIENT_CLINIC_OR_DEPARTMENT_OTHER)
Admission: EM | Admit: 2020-11-01 | Discharge: 2020-11-01 | Disposition: A | Discharge status: Home / Self Care | Payer: Medicaid Other | Attending: Emergency Medicine | Admitting: Emergency Medicine

## 2020-11-01 ENCOUNTER — Other Ambulatory Visit: Payer: Self-pay

## 2020-11-01 ENCOUNTER — Encounter (HOSPITAL_BASED_OUTPATIENT_CLINIC_OR_DEPARTMENT_OTHER): Payer: Self-pay

## 2020-11-01 DIAGNOSIS — E876 Hypokalemia: Secondary | ICD-10-CM | POA: Insufficient documentation

## 2020-11-01 DIAGNOSIS — E1169 Type 2 diabetes mellitus with other specified complication: Secondary | ICD-10-CM | POA: Diagnosis not present

## 2020-11-01 DIAGNOSIS — D509 Iron deficiency anemia, unspecified: Secondary | ICD-10-CM | POA: Insufficient documentation

## 2020-11-01 DIAGNOSIS — M79662 Pain in left lower leg: Secondary | ICD-10-CM

## 2020-11-01 DIAGNOSIS — L97929 Non-pressure chronic ulcer of unspecified part of left lower leg with unspecified severity: Secondary | ICD-10-CM | POA: Insufficient documentation

## 2020-11-01 DIAGNOSIS — I87312 Chronic venous hypertension (idiopathic) with ulcer of left lower extremity: Secondary | ICD-10-CM | POA: Diagnosis not present

## 2020-11-01 DIAGNOSIS — M79604 Pain in right leg: Secondary | ICD-10-CM | POA: Diagnosis present

## 2020-11-01 DIAGNOSIS — M79661 Pain in right lower leg: Secondary | ICD-10-CM

## 2020-11-01 DIAGNOSIS — E669 Obesity, unspecified: Secondary | ICD-10-CM | POA: Insufficient documentation

## 2020-11-01 DIAGNOSIS — D75839 Thrombocytosis, unspecified: Secondary | ICD-10-CM

## 2020-11-01 DIAGNOSIS — I87311 Chronic venous hypertension (idiopathic) with ulcer of right lower extremity: Secondary | ICD-10-CM | POA: Insufficient documentation

## 2020-11-01 DIAGNOSIS — L97919 Non-pressure chronic ulcer of unspecified part of right lower leg with unspecified severity: Secondary | ICD-10-CM | POA: Diagnosis not present

## 2020-11-01 DIAGNOSIS — I872 Venous insufficiency (chronic) (peripheral): Secondary | ICD-10-CM

## 2020-11-01 DIAGNOSIS — I890 Lymphedema, not elsewhere classified: Secondary | ICD-10-CM

## 2020-11-01 LAB — CBC WITH DIFFERENTIAL/PLATELET
Abs Immature Granulocytes: 0.04 10*3/uL (ref 0.00–0.07)
Basophils Absolute: 0 10*3/uL (ref 0.0–0.1)
Basophils Relative: 0 %
Eosinophils Absolute: 0.3 10*3/uL (ref 0.0–0.5)
Eosinophils Relative: 3 %
HCT: 29.9 % — ABNORMAL LOW (ref 36.0–46.0)
Hemoglobin: 9 g/dL — ABNORMAL LOW (ref 12.0–15.0)
Immature Granulocytes: 0 %
Lymphocytes Relative: 19 %
Lymphs Abs: 1.8 10*3/uL (ref 0.7–4.0)
MCH: 22.3 pg — ABNORMAL LOW (ref 26.0–34.0)
MCHC: 30.1 g/dL (ref 30.0–36.0)
MCV: 74.2 fL — ABNORMAL LOW (ref 80.0–100.0)
Monocytes Absolute: 0.8 10*3/uL (ref 0.1–1.0)
Monocytes Relative: 9 %
Neutro Abs: 6.3 10*3/uL (ref 1.7–7.7)
Neutrophils Relative %: 69 %
Platelets: 480 10*3/uL — ABNORMAL HIGH (ref 150–400)
RBC: 4.03 MIL/uL (ref 3.87–5.11)
RDW: 19.7 % — ABNORMAL HIGH (ref 11.5–15.5)
WBC: 9.3 10*3/uL (ref 4.0–10.5)
nRBC: 0 % (ref 0.0–0.2)

## 2020-11-01 LAB — BASIC METABOLIC PANEL
Anion gap: 8 (ref 5–15)
BUN: 8 mg/dL (ref 6–20)
CO2: 29 mmol/L (ref 22–32)
Calcium: 8.2 mg/dL — ABNORMAL LOW (ref 8.9–10.3)
Chloride: 98 mmol/L (ref 98–111)
Creatinine, Ser: 0.56 mg/dL (ref 0.44–1.00)
GFR, Estimated: >60 mL/min (ref >60)
Glucose, Bld: 102 mg/dL — ABNORMAL HIGH (ref 70–99)
Potassium: 3.2 mmol/L — ABNORMAL LOW (ref 3.5–5.1)
Sodium: 135 mmol/L (ref 135–145)

## 2020-11-01 LAB — SEDIMENTATION RATE: Sed Rate: 47 mm/hr — ABNORMAL HIGH (ref 0–22)

## 2020-11-01 MED ORDER — HYDROMORPHONE HCL 1 MG/ML IJ SOLN
1.0000 mg | Freq: Once | INTRAMUSCULAR | Status: AC
  Administered 2020-11-01: 1 mg via INTRAVENOUS
  Filled 2020-11-01: qty 1

## 2020-11-01 MED ORDER — POTASSIUM CHLORIDE CRYS ER 20 MEQ PO TBCR: 20.0000 meq | EXTENDED_RELEASE_TABLET | Freq: Two times a day (BID) | ORAL | 0 refills | Status: AC

## 2020-11-01 MED ORDER — POTASSIUM CHLORIDE CRYS ER 20 MEQ PO TBCR
40.0000 meq | EXTENDED_RELEASE_TABLET | Freq: Once | ORAL | Status: AC
  Administered 2020-11-01: 40 meq via ORAL
  Filled 2020-11-01: qty 2

## 2020-11-01 MED ORDER — ONDANSETRON HCL 4 MG/2ML IJ SOLN
4.0000 mg | Freq: Once | INTRAMUSCULAR | Status: AC
Start: 2020-11-01 — End: 2020-11-01
  Administered 2020-11-01: 4 mg via INTRAVENOUS
  Filled 2020-11-01: qty 2

## 2020-11-01 MED ORDER — OXYCODONE-ACETAMINOPHEN 5-325 MG PO TABS: 1.0000 | ORAL_TABLET | Freq: Three times a day (TID) | ORAL | 0 refills | Status: AC | PRN

## 2020-11-01 NOTE — ED Notes (Addendum)
Pt discharged home.  Pt requesting transport home-explained to pt that she does not meet criteria for PTAR transport. Pt given a cab voucher on 10/07/20-not able to be given one today due to narcotics being given during visit. Pt was on the phone with her mother during her visit and reports that her mother will be able to come pick her up later in the morning. Pt noted to be picking her skin off her legs and eating it. No distress noted.  Pt ambulatory but taken out to the ED lobby via wheelchair.  Pt reports 'if I had known yall were not going to give me a ride home, I would have never called an ambulance tonight'.

## 2020-11-01 NOTE — Discharge Instructions (Signed)
You will need to continue working with all members of your medical team to maintain adequate pain control and work towards healing of your ulcers and dealing with your leg swelling.  Return if pain is not being adequately controlled, or if you start running a fever.

## 2020-11-01 NOTE — ED Provider Notes (Signed)
[facility] EMERGENCY DEPARTMENT Provider Note   CSN: [id number] Arrival date & time: 11/01/20  0116     History Chief Complaint  Patient presents with  . Leg Pain    [name] [name] is a 30 y.o. female.  The history is provided by the patient.  Leg Pain She has history of diabetes, lymphedema, venous stasis ulcers with chronic pain in both legs and comes in complaining of tightness in her legs and increased pain.  She had been discharged from [facility] long hospital 2 days ago, and states that the tightness in her leg and pain have been getting worse over the last day.  She denies fever or chills.  She is very tearful and it is very difficult to get history from her.   Past Medical History:  Diagnosis Date  . Chronic pain of both lower extremities   . DM (diabetes mellitus) (HCC)   . Lymphedema of both lower extremities   . S/P debridement   . Venous stasis ulcer (HCC)     Patient Active Problem List   Diagnosis Date Noted  . Cellulitis, leg 10/27/2020  . Lymphedema 09/28/2020  . Cellulitis 09/27/2020  . Chronic pain syndrome 09/27/2020  . Bilateral edema of lower extremity 09/27/2020  . Anemia 09/27/2020  . Obesity, Class III, BMI 40-49.9 (morbid obesity) (HCC) 09/27/2020    History reviewed. No pertinent surgical history.   OB History   No obstetric history on file.     History reviewed. No pertinent family history.  Social History   Tobacco Use  . Smoking status: Never Smoker  . Smokeless tobacco: Never Used  Substance Use Topics  . Alcohol use: Not Currently    Home Medications Prior to Admission medications   Medication Sig Start Date End Date Taking? Authorizing Provider  docusate sodium (COLACE) 100 MG capsule Take 100 mg by mouth daily.    [provider]  ferrous gluconate (FERGON) 324 MG tablet Take 1 tablet (324 mg total) by mouth daily with breakfast. 10/02/20   Alwyn Ren, MD  gabapentin (NEURONTIN) 600 MG tablet  Take 600 mg by mouth 3 (three) times daily. 09/15/20   [provider]  ibuprofen (ADVIL) 200 MG tablet Take 400 mg by mouth every 6 (six) hours as needed for headache, mild pain or moderate pain.    [provider]  lidocaine (LIDODERM) 5 % Place 1 patch onto the skin daily. Remove & Discard patch within 12 hours or as directed by MD 10/11/20   Nicanor Alcon, April, MD  ondansetron (ZOFRAN) 4 MG tablet Take 1 tablet (4 mg total) by mouth every 6 (six) hours as needed for nausea. 10/01/20   Alwyn Ren, MD  oxyCODONE-acetaminophen (PERCOCET/ROXICET) 5-325 MG tablet Take 1 tablet by mouth every 8 (eight) hours as needed for up to 7 days for severe pain. 10/29/20 11/05/20  Alwyn Ren, MD  tiZANidine (ZANAFLEX) 4 MG tablet Take 4 mg by mouth at bedtime. 09/05/20   [provider]    Allergies    Silver  Review of Systems   Review of Systems  All other systems reviewed and are negative.   Physical Exam Updated Vital Signs BP 123/72 (BP Location: Right Arm)   Pulse 94   Temp 98.6 F (37 C) (Oral)   Resp 18   Ht 5\' 7"  (1.702 m)   Wt (!) 174.6 kg   SpO2 100%   BMI 60.30 kg/m   Physical Exam Vitals and nursing note reviewed.  Morbidly obese 30 year old female, in obvious pain, but in no acute distress. Vital signs are normal. Oxygen saturation is 100%, which is normal. Head is normocephalic and atraumatic. PERRLA, EOMI. Oropharynx is clear. Neck is nontender and supple without adenopathy or JVD. Back is nontender and there is no CVA tenderness. Lungs are clear without rales, wheezes, or rhonchi. Chest is nontender. Heart has regular rate and rhythm without murmur. Abdomen is soft, flat, nontender without masses or hepatosplenomegaly and peristalsis is normoactive. Extremities: Severe lymphedema bilaterally.  Stasis ulcers present the posterior aspect of the lower legs bilaterally without signs of infection present. Skin is warm and dry without  rash. Neurologic: Mental status is normal, cranial nerves are intact, moves all extremities equally.  ED Results / Procedures / Treatments   Labs (all labs ordered are listed, but only abnormal results are displayed) Labs Reviewed  BASIC METABOLIC PANEL - Abnormal; Notable for the following components:      Result Value   Potassium 3.2 (*)    Glucose, Bld 102 (*)    Calcium 8.2 (*)    All other components within normal limits  CBC WITH DIFFERENTIAL/PLATELET - Abnormal; Notable for the following components:   Hemoglobin 9.0 (*)    HCT 29.9 (*)    MCV 74.2 (*)    MCH 22.3 (*)    RDW 19.7 (*)    Platelets 480 (*)    All other components within normal limits  SEDIMENTATION RATE - Abnormal; Notable for the following components:   Sed Rate 47 (*)    All other components within normal limits   Procedures Procedures   Medications Ordered in ED Medications  HYDROmorphone (DILAUDID) injection 1 mg (1 mg Intravenous Given 11/01/20 0229)  ondansetron (ZOFRAN) injection 4 mg (4 mg Intravenous Given 11/01/20 0226)  potassium chloride SA (KLOR-CON) CR tablet 40 mEq (40 mEq Oral Given 11/01/20 0335)  HYDROmorphone (DILAUDID) injection 1 mg (1 mg Intravenous Given 11/01/20 1601)    ED Course  I have reviewed the triage vital signs and the nursing notes.  Pertinent labs & imaging results that were available during my care of the patient were reviewed by me and considered in my medical decision making (see chart for details).   MDM Rules/Calculators/A&P                         Chronic leg pain secondary to lymphedema and stasis ulcers.  Old records are reviewed, confirming recent hospitalization.  She had been on hydrocodone-acetaminophen 10-325, and was discharged on oxycodone-acetaminophen 5-325.  I suspect that some of her problem is from a subtherapeutic narcotic dose.  Unfortunately, I do not have any solution for her lymphedema and chronic stasis ulcer.  She is being seen at a wound  management center.  We will check CBC and sedimentation rate to make sure that there is no evidence of active infection, she will be given hydromorphone for pain.  She required a second dose of hydromorphone, but did get good relief of pain.  Labs show stable anemia, normal WBC with normal differential.  Metabolic panel is significant for hypokalemia and she is given a dose of oral potassium.  She is discharged with a prescription for a higher dose of oxycodone-acetaminophen as well as as prescription for K-Dur.  She is referred back to her wound care clinic and her pain management clinic.  Final Clinical Impression(s) / ED Diagnoses Final diagnoses:  Pain in both lower legs  Lymphedema  Venous stasis ulcer of calf with fat layer exposed without varicose veins, unspecified laterality (HCC)  Microcytic anemia  Hypokalemia    Rx / DC Orders ED Discharge Orders         Ordered    oxyCODONE-acetaminophen (PERCOCET) 5-325 MG tablet  Every 8 hours PRN        11/01/20 0405    potassium chloride SA (KLOR-CON) 20 MEQ tablet  2 times daily        11/01/20 0405           Dione Booze, MD 11/01/20 [phone]

## 2020-11-04 ENCOUNTER — Encounter (HOSPITAL_BASED_OUTPATIENT_CLINIC_OR_DEPARTMENT_OTHER): Payer: Self-pay | Admitting: Urology

## 2020-11-04 ENCOUNTER — Emergency Department (HOSPITAL_BASED_OUTPATIENT_CLINIC_OR_DEPARTMENT_OTHER)
Admission: EM | Admit: 2020-11-04 | Discharge: 2020-11-04 | Disposition: A | Discharge status: Home / Self Care | Payer: Medicaid Other | Attending: Emergency Medicine | Admitting: Emergency Medicine

## 2020-11-04 DIAGNOSIS — E119 Type 2 diabetes mellitus without complications: Secondary | ICD-10-CM | POA: Diagnosis not present

## 2020-11-04 DIAGNOSIS — I890 Lymphedema, not elsewhere classified: Secondary | ICD-10-CM | POA: Insufficient documentation

## 2020-11-04 DIAGNOSIS — M79661 Pain in right lower leg: Secondary | ICD-10-CM | POA: Diagnosis present

## 2020-11-04 DIAGNOSIS — G894 Chronic pain syndrome: Secondary | ICD-10-CM | POA: Diagnosis not present

## 2020-11-04 LAB — CBC WITH DIFFERENTIAL/PLATELET
Abs Immature Granulocytes: 0.02 10*3/uL (ref 0.00–0.07)
Basophils Absolute: 0 10*3/uL (ref 0.0–0.1)
Basophils Relative: 0 %
Eosinophils Absolute: 0.2 10*3/uL (ref 0.0–0.5)
Eosinophils Relative: 2 %
HCT: 30.2 % — ABNORMAL LOW (ref 36.0–46.0)
Hemoglobin: 9.1 g/dL — ABNORMAL LOW (ref 12.0–15.0)
Immature Granulocytes: 0 %
Lymphocytes Relative: 21 %
Lymphs Abs: 1.8 10*3/uL (ref 0.7–4.0)
MCH: 22 pg — ABNORMAL LOW (ref 26.0–34.0)
MCHC: 30.1 g/dL (ref 30.0–36.0)
MCV: 73.1 fL — ABNORMAL LOW (ref 80.0–100.0)
Monocytes Absolute: 0.8 10*3/uL (ref 0.1–1.0)
Monocytes Relative: 10 %
Neutro Abs: 5.7 10*3/uL (ref 1.7–7.7)
Neutrophils Relative %: 67 %
Platelets: 511 10*3/uL — ABNORMAL HIGH (ref 150–400)
RBC: 4.13 MIL/uL (ref 3.87–5.11)
RDW: 19.5 % — ABNORMAL HIGH (ref 11.5–15.5)
WBC: 8.6 10*3/uL (ref 4.0–10.5)
nRBC: 0 % (ref 0.0–0.2)

## 2020-11-04 LAB — BASIC METABOLIC PANEL
Anion gap: 9 (ref 5–15)
BUN: 7 mg/dL (ref 6–20)
CO2: 27 mmol/L (ref 22–32)
Calcium: 8.6 mg/dL — ABNORMAL LOW (ref 8.9–10.3)
Chloride: 100 mmol/L (ref 98–111)
Creatinine, Ser: 0.56 mg/dL (ref 0.44–1.00)
GFR, Estimated: >60 mL/min (ref >60)
Glucose, Bld: 94 mg/dL (ref 70–99)
Potassium: 3.5 mmol/L (ref 3.5–5.1)
Sodium: 136 mmol/L (ref 135–145)

## 2020-11-04 LAB — SEDIMENTATION RATE: Sed Rate: 48 mm/hr — ABNORMAL HIGH (ref 0–22)

## 2020-11-04 MED ORDER — KETOROLAC TROMETHAMINE 30 MG/ML IJ SOLN
30.0000 mg | Freq: Once | INTRAMUSCULAR | Status: AC
  Administered 2020-11-04: 30 mg via INTRAVENOUS
  Filled 2020-11-04: qty 1

## 2020-11-04 MED ORDER — HYDROMORPHONE HCL 1 MG/ML IJ SOLN
1.0000 mg | Freq: Once | INTRAMUSCULAR | Status: AC
  Administered 2020-11-04: 1 mg via INTRAVENOUS
  Filled 2020-11-04: qty 1

## 2020-11-04 NOTE — ED Triage Notes (Signed)
Bilateral Posterior lower leg pain and swelling (open wounds), d/c from [facility] 6 days ago with #30 Oxycondone, pt reports is out of all pain meds now. States pain medication not working.  Pt tearful, leaning over bed, unable to lay r/t pain

## 2020-11-04 NOTE — ED Notes (Signed)
Ride called but is coming from [place], 45 mins away, per PT.  Pt given supplies to clean and wrap legs per request but does not want help doing so.  Pt is ok with discharge plan.  Strict education and DC instructions given regarding follow-up to wound care and pain clinic for adjustment of pain medications and regular wound care.  Pt does understand these instructions and states she will follow-up appropriately upon DC.  Pt is also instructed of alternate therapeutic measures to avoid opiate overuse such as rest, elevation and wrapping her legs/keeping them wrapped when ambulating.  Pt verbalizes understanding of instructions and suggestions.

## 2020-11-04 NOTE — ED Provider Notes (Signed)
[facility] EMERGENCY DEPARTMENT Provider Note   CSN: [id number] Arrival date & time: 11/04/20  7371     History Chief Complaint  Patient presents with  . Leg Pain  . Wound Infection    [name] [name] is a 30 y.o. female.  HPI Patient presents with pain in her lower legs.  Chronic wounds.  Chronic cellulitis.  Chronic lymphedema.  Had recent admission the hospital.  He is on chronic pain medicines.  Went to the ER couple days ago and given more pain medicines.  States pain is unrelieved.  States she did have a fever yesterday but resolved.  States she cannot get into see her pain doctor until June 3.  Has had 13 ER visits in the last 2 months.  States the last doctor told her that she needs more pain medicines.    Past Medical History:  Diagnosis Date  . Chronic pain of both lower extremities   . DM (diabetes mellitus) (HCC)   . Lymphedema of both lower extremities   . S/P debridement   . Venous stasis ulcer (HCC)     Patient Active Problem List   Diagnosis Date Noted  . Cellulitis, leg 10/27/2020  . Lymphedema 09/28/2020  . Cellulitis 09/27/2020  . Chronic pain syndrome 09/27/2020  . Bilateral edema of lower extremity 09/27/2020  . Anemia 09/27/2020  . Obesity, Class III, BMI 40-49.9 (morbid obesity) (HCC) 09/27/2020    History reviewed. No pertinent surgical history.   OB History   No obstetric history on file.     History reviewed. No pertinent family history.  Social History   Tobacco Use  . Smoking status: Never Smoker  . Smokeless tobacco: Never Used  Substance Use Topics  . Alcohol use: Not Currently    Home Medications Prior to Admission medications   Medication Sig Start Date End Date Taking? Authorizing Provider  oxyCODONE-acetaminophen (PERCOCET) 5-325 MG tablet Take 1 tablet by mouth every 8 (eight) hours as needed for moderate pain or severe pain. 11/01/20  Yes Dione Booze, MD  docusate sodium (COLACE) 100 MG capsule Take 100 mg  by mouth daily.    [provider]  ferrous gluconate (FERGON) 324 MG tablet Take 1 tablet (324 mg total) by mouth daily with breakfast. 10/02/20   Alwyn Ren, MD  gabapentin (NEURONTIN) 600 MG tablet Take 600 mg by mouth 3 (three) times daily. 09/15/20   [provider]  ondansetron (ZOFRAN) 4 MG tablet Take 1 tablet (4 mg total) by mouth every 6 (six) hours as needed for nausea. 10/01/20   Alwyn Ren, MD  potassium chloride SA (KLOR-CON) 20 MEQ tablet Take 1 tablet (20 mEq total) by mouth 2 (two) times daily. 11/01/20   Dione Booze, MD  tiZANidine (ZANAFLEX) 4 MG tablet Take 4 mg by mouth at bedtime. 09/05/20   [provider]    Allergies    Silver  Review of Systems   Review of Systems  Constitutional: Positive for fever. Negative for appetite change.  Respiratory: Negative for shortness of breath.   Cardiovascular: Negative for chest pain.  Gastrointestinal: Negative for abdominal pain.  Genitourinary: Negative for flank pain.  Musculoskeletal: Negative for back pain.  Skin: Positive for wound.  Neurological: Negative for weakness.  Psychiatric/Behavioral: Negative for confusion.    Physical Exam Updated Vital Signs BP 139/89   Pulse 73   Temp 98 F (36.7 C) (Oral)   Resp 18   Ht 5\' 7"  (1.702 m)   Wt )  174.7 kg   SpO2 100%   BMI 60.31 kg/m   Physical Exam Vitals and nursing note reviewed.  Constitutional:      Appearance: She is obese.     Comments: Patient is crying loudly.  HENT:     Head: Atraumatic.  Cardiovascular:     Rate and Rhythm: Regular rhythm.  Abdominal:     Tenderness: There is no abdominal tenderness.  Musculoskeletal:     Comments: Chronic lymphedema bilateral lower extremities.  Chronic wounds posteriorly.  No drainage.  Skin:    Capillary Refill: Capillary refill takes less than 2 seconds.  Neurological:     Mental Status: She is alert and oriented to person, place, and time.  Psychiatric:         Mood and Affect: Mood normal.     ED Results / Procedures / Treatments   Labs (all labs ordered are listed, but only abnormal results are displayed) Labs Reviewed  CBC WITH DIFFERENTIAL/PLATELET - Abnormal; Notable for the following components:      Result Value   Hemoglobin 9.1 (*)    HCT 30.2 (*)    MCV 73.1 (*)    MCH 22.0 (*)    RDW 19.5 (*)    Platelets 511 (*)    All other components within normal limits  BASIC METABOLIC PANEL - Abnormal; Notable for the following components:   Calcium 8.6 (*)    All other components within normal limits  SEDIMENTATION RATE - Abnormal; Notable for the following components:   Sed Rate 48 (*)    All other components within normal limits    EKG None  Radiology No results found.  Procedures Procedures   Medications Ordered in ED Medications  ketorolac (TORADOL) 30 MG/ML injection 30 mg (30 mg Intravenous Given 11/04/20 1016)  HYDROmorphone (DILAUDID) injection 1 mg (1 mg Intravenous Given 11/04/20 1106)    ED Course  I have reviewed the triage vital signs and the nursing notes.  Pertinent labs & imaging results that were available during my care of the patient were reviewed by me and considered in my medical decision making (see chart for details).    MDM Rules/Calculators/A&P                          Patient with acute on chronic pain.  Has been using her home medicines and possibly more than intended.  Reviewed drug database that should have pills at home.  States she cannot get seen by her pain management until beginning in June.  I attempted to call the pain doctors however they were all at a meeting and unable to be reached.  Lab work stable from before.  Do not think we have a new infection.  Needs outpatient follow-up with PCP/pain medicine for further dosing.  The ER is not the place for her pain control Final Clinical Impression(s) / ED Diagnoses Final diagnoses:  Lymphedema  Chronic pain syndrome    Rx / DC  Orders ED Discharge Orders    None       Benjiman Core, MD 11/04/20 1410

## 2020-11-04 NOTE — Discharge Instructions (Addendum)
Follow-up with the pain medicine doctors.  It does not look as if there is a new infection.  Your pain medicine doctors need to be the ones controlling the pain the emergency room

## 2020-11-04 NOTE — ED Notes (Signed)
Pt wheeled to WR to wait on ride

## 2020-11-12 ENCOUNTER — Emergency Department (HOSPITAL_BASED_OUTPATIENT_CLINIC_OR_DEPARTMENT_OTHER)
Admission: EM | Admit: 2020-11-12 | Discharge: 2020-11-12 | Disposition: A | Discharge status: Home / Self Care | Payer: Medicaid Other | Attending: Emergency Medicine | Admitting: Emergency Medicine

## 2020-11-12 ENCOUNTER — Other Ambulatory Visit: Payer: Self-pay

## 2020-11-12 DIAGNOSIS — G8929 Other chronic pain: Secondary | ICD-10-CM | POA: Insufficient documentation

## 2020-11-12 DIAGNOSIS — E119 Type 2 diabetes mellitus without complications: Secondary | ICD-10-CM | POA: Insufficient documentation

## 2020-11-12 DIAGNOSIS — I890 Lymphedema, not elsewhere classified: Secondary | ICD-10-CM | POA: Insufficient documentation

## 2020-11-12 DIAGNOSIS — L98492 Non-pressure chronic ulcer of skin of other sites with fat layer exposed: Secondary | ICD-10-CM | POA: Diagnosis not present

## 2020-11-12 DIAGNOSIS — M79605 Pain in left leg: Secondary | ICD-10-CM

## 2020-11-12 MED ORDER — HYDROMORPHONE HCL 1 MG/ML IJ SOLN
2.0000 mg | Freq: Once | INTRAMUSCULAR | Status: AC
  Administered 2020-11-12: 2 mg via INTRAMUSCULAR
  Filled 2020-11-12: qty 2

## 2020-11-12 MED ORDER — ONDANSETRON 4 MG PO TBDP
8.0000 mg | ORAL_TABLET | Freq: Once | ORAL | Status: AC
  Administered 2020-11-12: 8 mg via ORAL
  Filled 2020-11-12: qty 2

## 2020-11-12 NOTE — ED Provider Notes (Signed)
[facility] Provider Note: Lowella Dell, MD, FACEP  CSN: [id number] MRN: [id number] ARRIVAL: 11/12/20 at 0112 ROOM: MH06/[facility]06   CHIEF COMPLAINT  Leg Pain   HISTORY OF PRESENT ILLNESS  11/12/20 1:38 AM [name] [name] is a 30 y.o. female with lymphedema and chronic nonhealing wounds of her lower legs.  She is on chronic narcotics, receiving a total of 15 narcotic prescriptions since November of last year including hydrocodone, 120 tablets monthly.  Her last narcotic prescription was for 20 Percocet tablets filled 11/08/2020.  She is here stating her pain is 10 out of 10 and she has not slept in 2 days despite taking oxycodone.  She states she is unable to lie in bed due to the pain.  Any movement or palpation of her legs exacerbates the pain.   Past Medical History:  Diagnosis Date  . Chronic pain of both lower extremities   . DM (diabetes mellitus) (HCC)   . Lymphedema of both lower extremities   . S/P debridement   . Venous stasis ulcer (HCC)     No past surgical history on file.  No family history on file.  Social History   Tobacco Use  . Smoking status: Never Smoker  . Smokeless tobacco: Never Used  Substance Use Topics  . Alcohol use: Not Currently    Prior to Admission medications   Medication Sig Start Date End Date Taking? Authorizing Provider  docusate sodium (COLACE) 100 MG capsule Take 100 mg by mouth daily.    [provider]  ferrous gluconate (FERGON) 324 MG tablet Take 1 tablet (324 mg total) by mouth daily with breakfast. 10/02/20   Alwyn Ren, MD  gabapentin (NEURONTIN) 600 MG tablet Take 600 mg by mouth 3 (three) times daily. 09/15/20   [provider]  ondansetron (ZOFRAN) 4 MG tablet Take 1 tablet (4 mg total) by mouth every 6 (six) hours as needed for nausea. 10/01/20   Alwyn Ren, MD  oxyCODONE-acetaminophen (PERCOCET) 5-325 MG tablet Take 1 tablet by mouth every 8 (eight) hours as needed for moderate  pain or severe pain. 11/01/20   Dione Booze, MD  potassium chloride SA (KLOR-CON) 20 MEQ tablet Take 1 tablet (20 mEq total) by mouth 2 (two) times daily. 11/01/20   Dione Booze, MD  tiZANidine (ZANAFLEX) 4 MG tablet Take 4 mg by mouth at bedtime. 09/05/20   [provider]    Allergies Silver   REVIEW OF SYSTEMS  Negative except as noted here or in the History of Present Illness.   PHYSICAL EXAMINATION  Initial Vital Signs Blood pressure (!) 150/103, pulse (!) 105, temperature 98.4 F (36.9 C), temperature source Oral, resp. rate 20, SpO2 99 %.  Examination General: Well-developed, well-nourished female in no acute distress; appearance consistent with age of record HENT: normocephalic; atraumatic Eyes: Normal appearance Neck: supple Heart: regular rate and rhythm Lungs: clear to auscultation bilaterally Abdomen: soft; nondistended; nontender;  bowel sounds present Extremities: Lymphedema of lower extremities:    Neurologic: Awake, alert and oriented; motor function intact in all extremities and symmetric; no facial droop Skin: Warm and dry; chronic appearing wounds of lower legs without surrounding erythema or warmth:    Psychiatric: Tearful   RESULTS  Summary of this visit's results, reviewed and interpreted by myself:   EKG Interpretation  Date/Time:    Ventricular Rate:    PR Interval:    QRS Duration:   QT Interval:    QTC Calculation:   R Axis:  Text Interpretation:        Laboratory Studies: No results found for this or any previous visit (from the past 24 hour(s)). Imaging Studies: No results found.  ED COURSE and MDM  Nursing notes, initial and subsequent vitals signs, including pulse oximetry, reviewed and interpreted by myself.  Vitals:   11/12/20 0122  BP: (!) 150/103  Pulse: (!) 105  Resp: 20  Temp: 98.4 F (36.9 C)  TempSrc: Oral  SpO2: 99%   Medications  ondansetron (ZOFRAN-ODT) disintegrating tablet 8 mg (has no  administration in time range)  HYDROmorphone (DILAUDID) injection 2 mg (has no administration in time range)    The patient was advised that because she is already on chronic narcotic management we cannot write for any additional prescriptions.  We will treat her with Dilaudid in the emergency department and dress her wounds.  There is no erythema or warmth surrounding her wounds and no purulent drainage to suggest acute infection.  She states she does get treatment at a wound care center.  PROCEDURES  Procedures   ED DIAGNOSES     ICD-10-CM   1. 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 Lymphedema of both lower extremities  I89.0   3. Chronic pain of both lower extremities  M79.604    M79.605    G89.29        Daronte Shostak, Jonny Ruiz, MD 11/12/20 [phone]

## 2020-11-12 NOTE — ED Triage Notes (Signed)
BLE pain 10/10 have not slept in 2 days. Pt has taken oxycodone 5mg , med has been ineffective. She notes, "I am unable to lie in the bed.

## 2021-02-22 ENCOUNTER — Encounter (HOSPITAL_COMMUNITY): Payer: Self-pay

## 2021-02-22 ENCOUNTER — Emergency Department (HOSPITAL_COMMUNITY)
Admission: EM | Admit: 2021-02-22 | Discharge: 2021-02-23 | Disposition: A | Discharge status: Home / Self Care | Payer: Medicaid Other | Attending: Emergency Medicine | Admitting: Emergency Medicine

## 2021-02-22 ENCOUNTER — Other Ambulatory Visit: Payer: Self-pay

## 2021-02-22 DIAGNOSIS — M79604 Pain in right leg: Secondary | ICD-10-CM | POA: Insufficient documentation

## 2021-02-22 DIAGNOSIS — E119 Type 2 diabetes mellitus without complications: Secondary | ICD-10-CM | POA: Insufficient documentation

## 2021-02-22 DIAGNOSIS — I890 Lymphedema, not elsewhere classified: Secondary | ICD-10-CM | POA: Diagnosis not present

## 2021-02-22 DIAGNOSIS — M79605 Pain in left leg: Secondary | ICD-10-CM | POA: Insufficient documentation

## 2021-02-22 NOTE — ED Triage Notes (Signed)
Pt BIB EMS. Pt complains of leg pain and foot pain from her lymphedema x 24 hrs. Pt states that she is out of her pain medication and that her primary said that she will have to see a pain specialist for her Hydrocodone.

## 2021-02-23 MED ORDER — HYDROMORPHONE HCL 1 MG/ML IJ SOLN
1.0000 mg | Freq: Once | INTRAMUSCULAR | Status: AC
  Administered 2021-02-23: 1 mg via INTRAMUSCULAR
  Filled 2021-02-23: qty 1

## 2021-02-23 NOTE — Discharge Instructions (Signed)
Follow-up with your pain medication specialist if you have additional medication needs. Return here for any new/acute changes.

## 2021-02-23 NOTE — ED Notes (Signed)
Patient has no concerns after AVS has been reviewed and patient education provided. Patient discharged. 

## 2021-02-23 NOTE — ED Provider Notes (Signed)
[facility]-EMERGENCY DEPT Provider Note   CSN: [id number] Arrival date & time: 02/22/21  1915     History Chief Complaint  Patient presents with   Leg Pain   Foot Pain    [name] [name] is a 30 y.o. female.  The history is provided by the patient and medical records.  Leg Pain Foot Pain  30 y.o. F with hx of lymphedema to BLE, DM, venous stasis ulcers, presenting to the ED with bilateral leg pain.  Patient reports she has been having uncontrolled pain for 24 hours.  She initially reported to triage RN that she is out of her pain medication, however reports to me that she is not out, it just is not working any longer.  She denies any new changes with her legs-- no new redness, no drastic increase in swelling, no fevers.  She has been seeing wound care as scheduled.  Past Medical History:  Diagnosis Date   Chronic pain of both lower extremities    DM (diabetes mellitus) (HCC)    Lymphedema of both lower extremities    S/P debridement    Venous stasis ulcer (HCC)     Patient Active Problem List   Diagnosis Date Noted   Cellulitis, leg 10/27/2020   Lymphedema 09/28/2020   Cellulitis 09/27/2020   Chronic pain syndrome 09/27/2020   Bilateral edema of lower extremity 09/27/2020   Anemia 09/27/2020   Obesity, Class III, BMI 40-49.9 (morbid obesity) (HCC) 09/27/2020    History reviewed. No pertinent surgical history.   OB History   No obstetric history on file.     History reviewed. No pertinent family history.  Social History   Tobacco Use   Smoking status: Never   Smokeless tobacco: Never  Substance Use Topics   Alcohol use: Not Currently    Home Medications Prior to Admission medications   Medication Sig Start Date End Date Taking? Authorizing Provider  docusate sodium (COLACE) 100 MG capsule Take 100 mg by mouth daily.    [provider]  ferrous gluconate (FERGON) 324 MG tablet Take 1 tablet (324 mg total) by mouth daily with  breakfast. 10/02/20   Alwyn Ren, MD  gabapentin (NEURONTIN) 600 MG tablet Take 600 mg by mouth 3 (three) times daily. 09/15/20   [provider]  ondansetron (ZOFRAN) 4 MG tablet Take 1 tablet (4 mg total) by mouth every 6 (six) hours as needed for nausea. 10/01/20   Alwyn Ren, MD  oxyCODONE-acetaminophen (PERCOCET) 5-325 MG tablet Take 1 tablet by mouth every 8 (eight) hours as needed for moderate pain or severe pain. 11/01/20   Dione Booze, MD  potassium chloride SA (KLOR-CON) 20 MEQ tablet Take 1 tablet (20 mEq total) by mouth 2 (two) times daily. 11/01/20   Dione Booze, MD  tiZANidine (ZANAFLEX) 4 MG tablet Take 4 mg by mouth at bedtime. 09/05/20   [provider]    Allergies    Silver  Review of Systems   Review of Systems  Musculoskeletal:  Positive for arthralgias.  All other systems reviewed and are negative.  Physical Exam Updated Vital Signs BP (!) 145/102   Pulse (!) 124   Temp 98 F (36.7 C) (Oral)   Resp 20   SpO2 100%   Physical Exam Vitals and nursing note reviewed.  Constitutional:      Appearance: She is well-developed.  HENT:     Head: Normocephalic and atraumatic.  Eyes:     Conjunctiva/sclera: Conjunctivae normal.  Pupils: Pupils are equal, round, and reactive to light.  Cardiovascular:     Rate and Rhythm: Normal rate and regular rhythm.     Heart sounds: Normal heart sounds.  Pulmonary:     Effort: Pulmonary effort is normal.     Breath sounds: Normal breath sounds.  Abdominal:     General: Bowel sounds are normal.     Palpations: Abdomen is soft.  Musculoskeletal:        General: Normal range of motion.     Cervical back: Normal range of motion.     Comments: Significant lymphedema of BLE Chronic appearing wounds to bilateral posterior lower legs, margins are clean without purulent drainage, no surrounding erythema or induration  Skin:    General: Skin is warm and dry.  Neurological:     Mental Status:  She is alert and oriented to person, place, and time.    ED Results / Procedures / Treatments   Labs (all labs ordered are listed, but only abnormal results are displayed) Labs Reviewed - No data to display  EKG None  Radiology No results found.  Procedures Procedures   Medications Ordered in ED Medications  HYDROmorphone (DILAUDID) injection 1 mg (1 mg Intramuscular Given 02/23/21 0058)    ED Course  I have reviewed the triage vital signs and the nursing notes.  Pertinent labs & imaging results that were available during my care of the patient were reviewed by me and considered in my medical decision making (see chart for details).    MDM Rules/Calculators/A&P                           30 year old female here with chronic pain to the bilateral lower extremities over the past 48 hours.  Her story is somewhat inconsistent as she initially told triage RN that she is out of her pain medication, however tells me that her medication is simply "not working".  Per chart review, seen frequently in various ED's and often runs out of her pain medication early.  She does see pain management and last had hydrocodone 10-325mg  filled 02/13/21 for #56 tablets.  She does have chronic ulcers of the legs, these appear largely unchanged from last photos taken in June.  She does not have any signs of superimposed infection on exam.  I suspect this is her chronic pain.  Given dose of medications in ED.  Advised she will not receive narcotic prescriptions from ED, she will need to contact her pain management specialist if further needs.  Return here for new concerns.  Final Clinical Impression(s) / ED Diagnoses Final diagnoses:  Lymphedema    Rx / DC Orders ED Discharge Orders     None        Garlon Hatchet, PA-C 02/23/21 0131    Palumbo, April, MD 02/23/21 0246

## 2021-02-23 NOTE — ED Notes (Signed)
Social work consult put in.Patient is waiting in the waiting room.

## 2021-02-23 NOTE — ED Notes (Signed)
Patient provided with wound care supplies to wrap her legs.

## 2021-05-13 ENCOUNTER — Encounter (HOSPITAL_BASED_OUTPATIENT_CLINIC_OR_DEPARTMENT_OTHER): Payer: Medicaid Other | Attending: Internal Medicine | Admitting: Internal Medicine

## 2021-07-21 ENCOUNTER — Encounter (HOSPITAL_COMMUNITY): Payer: Self-pay

## 2021-07-21 ENCOUNTER — Emergency Department (HOSPITAL_COMMUNITY)
Admission: EM | Admit: 2021-07-21 | Discharge: 2021-07-21 | Disposition: A | Discharge status: Home / Self Care | Payer: Medicaid Other | Attending: Emergency Medicine | Admitting: Emergency Medicine

## 2021-07-21 DIAGNOSIS — T148XXA Other injury of unspecified body region, initial encounter: Secondary | ICD-10-CM

## 2021-07-21 DIAGNOSIS — S81802A Unspecified open wound, left lower leg, initial encounter: Secondary | ICD-10-CM | POA: Diagnosis not present

## 2021-07-21 DIAGNOSIS — X58XXXA Exposure to other specified factors, initial encounter: Secondary | ICD-10-CM | POA: Insufficient documentation

## 2021-07-21 DIAGNOSIS — M79661 Pain in right lower leg: Secondary | ICD-10-CM | POA: Insufficient documentation

## 2021-07-21 DIAGNOSIS — S81801A Unspecified open wound, right lower leg, initial encounter: Secondary | ICD-10-CM | POA: Diagnosis present

## 2021-07-21 DIAGNOSIS — I890 Lymphedema, not elsewhere classified: Secondary | ICD-10-CM | POA: Insufficient documentation

## 2021-07-21 DIAGNOSIS — M79662 Pain in left lower leg: Secondary | ICD-10-CM | POA: Diagnosis not present

## 2021-07-21 DIAGNOSIS — M79604 Pain in right leg: Secondary | ICD-10-CM

## 2021-07-21 LAB — CBC WITH DIFFERENTIAL/PLATELET
Abs Immature Granulocytes: 0.01 10*3/uL (ref 0.00–0.07)
Basophils Absolute: 0 10*3/uL (ref 0.0–0.1)
Basophils Relative: 0 %
Eosinophils Absolute: 0.2 10*3/uL (ref 0.0–0.5)
Eosinophils Relative: 2 %
HCT: 27.3 % — ABNORMAL LOW (ref 36.0–46.0)
Hemoglobin: 8 g/dL — ABNORMAL LOW (ref 12.0–15.0)
Immature Granulocytes: 0 %
Lymphocytes Relative: 16 %
Lymphs Abs: 1.4 10*3/uL (ref 0.7–4.0)
MCH: 20.3 pg — ABNORMAL LOW (ref 26.0–34.0)
MCHC: 29.3 g/dL — ABNORMAL LOW (ref 30.0–36.0)
MCV: 69.3 fL — ABNORMAL LOW (ref 80.0–100.0)
Monocytes Absolute: 0.9 10*3/uL (ref 0.1–1.0)
Monocytes Relative: 10 %
Neutro Abs: 6 10*3/uL (ref 1.7–7.7)
Neutrophils Relative %: 72 %
Platelets: 483 10*3/uL — ABNORMAL HIGH (ref 150–400)
RBC: 3.94 MIL/uL (ref 3.87–5.11)
RDW: 20.8 % — ABNORMAL HIGH (ref 11.5–15.5)
WBC: 8.5 10*3/uL (ref 4.0–10.5)
nRBC: 0 % (ref 0.0–0.2)

## 2021-07-21 LAB — BASIC METABOLIC PANEL
Anion gap: 5 (ref 5–15)
BUN: 10 mg/dL (ref 6–20)
CO2: 27 mmol/L (ref 22–32)
Calcium: 8.6 mg/dL — ABNORMAL LOW (ref 8.9–10.3)
Chloride: 105 mmol/L (ref 98–111)
Creatinine, Ser: 0.63 mg/dL (ref 0.44–1.00)
GFR, Estimated: >60 mL/min (ref >60)
Glucose, Bld: 87 mg/dL (ref 70–99)
Potassium: 3.7 mmol/L (ref 3.5–5.1)
Sodium: 137 mmol/L (ref 135–145)

## 2021-07-21 MED ORDER — HYDROCODONE-ACETAMINOPHEN 5-325 MG PO TABS
2.0000 | ORAL_TABLET | Freq: Once | ORAL | Status: AC
  Administered 2021-07-21: 2 via ORAL
  Filled 2021-07-21: qty 2

## 2021-07-21 MED ORDER — SILVER SULFADIAZINE 1 % EX CREA: 1.0000 "application " | TOPICAL_CREAM | Freq: Every day | CUTANEOUS | 0 refills | Status: AC

## 2021-07-21 NOTE — ED Triage Notes (Signed)
Pt presents to the ED via EMS for bilateral ulcerated leg wounds. Per EMS, pt was seen at [facility] this morning for the same and was given Toradol and Norco and discharged. Pt is supposed to follow-up with wound are however she states her wound care is in [place] and she states, "transportation will take two weeks to set-up." Pt has a hx of lymph edema and lower extremity swelling and states this has been an ongoing problem for the past three years. Pt reports fever/chills and nausea/vomiting "since last night."

## 2021-07-21 NOTE — ED Notes (Signed)
This nurse notified PA-C that both a lactic and blood cultures were obtained on pt's arrival to the ED, however, PA-C declines septic work-up at this time.

## 2021-07-21 NOTE — ED Notes (Signed)
PA-C at the bedside.  ?

## 2021-07-21 NOTE — ED Notes (Signed)
PA-C notified of pt's increasing bilateral lower leg discomfort. No additional orders at this time. Will continue to monitor.

## 2021-07-21 NOTE — ED Notes (Signed)
Per Secretary, wound care attempted to be paged to dress patient's wounds, per PA-C's request for pt's wounds to be dressed. Per unit secretary, wound care no longer in house for today. PA-C notified and aware.

## 2021-07-21 NOTE — Discharge Instructions (Signed)
Your blood work did not show any signs of infection.  Your exam also does not show any signs concerning for infection.  Recommend you follow-up with your wound clinic.  Please give them a call to schedule a sooner appointment if possible.  Also follow-up with your pain management clinic for further management of your pain.

## 2021-07-21 NOTE — ED Provider Notes (Signed)
[facility]-EMERGENCY DEPT Provider Note   CSN: [id number] Arrival date & time: 07/21/21  1407     History  Chief Complaint  Patient presents with   Leg Injury    Bilateral leg wounds     [name] [name] Husband is a 31 y.o. female.  31 year old female with past medical history of bilateral lower extremity lymphedema and chronic wounds presents today for evaluation of wound evaluation and concern for infection and uncontrolled pain.  Patient was evaluated in [facility] regional emergency room earlier today and discharged with plans to follow-up with wound care.  Patient states her wound care appointment is 2 weeks away.  She presents today with undressed wounds.  States that her mom typically helps her with wound dressing.  She states she typically takes Percocet but she has run out and missed her appointment today with pain management clinic.  The history is provided by the patient. No language interpreter was used.      Home Medications Prior to Admission medications   Medication Sig Start Date End Date Taking? Authorizing Provider  docusate sodium (COLACE) 100 MG capsule Take 100 mg by mouth daily.    [provider]  ferrous gluconate (FERGON) 324 MG tablet Take 1 tablet (324 mg total) by mouth daily with breakfast. 10/02/20   Alwyn Ren, MD  gabapentin (NEURONTIN) 600 MG tablet Take 600 mg by mouth 3 (three) times daily. 09/15/20   [provider]  ondansetron (ZOFRAN) 4 MG tablet Take 1 tablet (4 mg total) by mouth every 6 (six) hours as needed for nausea. 10/01/20   Alwyn Ren, MD  oxyCODONE-acetaminophen (PERCOCET) 5-325 MG tablet Take 1 tablet by mouth every 8 (eight) hours as needed for moderate pain or severe pain. 11/01/20   Dione Booze, MD  potassium chloride SA (KLOR-CON) 20 MEQ tablet Take 1 tablet (20 mEq total) by mouth 2 (two) times daily. 11/01/20   Dione Booze, MD  tiZANidine (ZANAFLEX) 4 MG tablet Take 4 mg by mouth at  bedtime. 09/05/20   [provider]      Allergies    Silver    Review of Systems   Review of Systems  Constitutional:  Negative for chills and fever.  Respiratory:  Negative for shortness of breath.   Cardiovascular:  Negative for chest pain and palpitations.  Skin:  Positive for wound.  Neurological:  Negative for weakness and light-headedness.  All other systems reviewed and are negative.  Physical Exam Updated Vital Signs BP (!) 168/145    Pulse (!) 113    Temp 98.2 F (36.8 C)    Resp (!) 24    SpO2 100%  Physical Exam Vitals and nursing note reviewed.  Constitutional:      General: She is not in acute distress.    Appearance: Normal appearance. She is not ill-appearing.  HENT:     Head: Normocephalic and atraumatic.     Nose: Nose normal.  Eyes:     Conjunctiva/sclera: Conjunctivae normal.  Pulmonary:     Effort: Pulmonary effort is normal. No respiratory distress.  Musculoskeletal:        General: No deformity.  Skin:    Findings: No rash.     Comments: Bilateral lower extremity posterior wounds present.  See attached image.  Without apparent drainage, or surrounding erythema.  Neurological:     Mental Status: She is alert.         ED Results / Procedures / Treatments   Labs (  all labs ordered are listed, but only abnormal results are displayed) Labs Reviewed - No data to display  EKG None  Radiology No results found.  Procedures Procedures    Medications Ordered in ED Medications - No data to display  ED Course/ Medical Decision Making/ A&P                           Medical Decision Making Amount and/or Complexity of Data Reviewed Labs: ordered.  Risk Prescription drug management.   31 year old female with past medical history about lower extremity lymphedema and chronic wounds presents for evaluation of wound evaluation and pain.  Per chart review patient has frequent visits to [facility] regional emergency room including this  morning prior to arriving to this emergency room.  Patient at that time was without concern for infection.  Labs were repeated today given her most recent labs before today were 1/22 did not show leukocytosis.  Exam did not show concern for cellulitis or other signs of infection.  Patient was given a dose of pain medication in the emergency room.  She requested additional IV pain medication which was not provided today.  Discussed with her the importance of follow-up with her wound care clinic and pain management clinic for ongoing management.  She is appropriate for discharge.  Discharged in stable condition.  Nurse notified to dress her wounds with ABD and Kerlix.  Patient requests silver sulfadiazine to be sent to her pharmacy.   Final Clinical Impression(s) / ED Diagnoses Final diagnoses:  Lymphedema  Pain in both lower extremities  Open wound    Rx / DC Orders ED Discharge Orders          Ordered    silver sulfADIAZINE (SILVADENE) 1 % cream  Daily        07/21/21 1756              Marita Kansas, PA-C 07/21/21 Ambrose Pancoast, MD 07/22/21 2322

## 2021-08-08 ENCOUNTER — Emergency Department (HOSPITAL_BASED_OUTPATIENT_CLINIC_OR_DEPARTMENT_OTHER)
Admission: EM | Admit: 2021-08-08 | Discharge: 2021-08-09 | Disposition: A | Discharge status: Home / Self Care | Payer: Medicaid Other | Attending: Emergency Medicine | Admitting: Emergency Medicine

## 2021-08-08 ENCOUNTER — Encounter (HOSPITAL_BASED_OUTPATIENT_CLINIC_OR_DEPARTMENT_OTHER): Payer: Self-pay

## 2021-08-08 DIAGNOSIS — S81802A Unspecified open wound, left lower leg, initial encounter: Secondary | ICD-10-CM | POA: Diagnosis not present

## 2021-08-08 DIAGNOSIS — L97919 Non-pressure chronic ulcer of unspecified part of right lower leg with unspecified severity: Secondary | ICD-10-CM

## 2021-08-08 DIAGNOSIS — I890 Lymphedema, not elsewhere classified: Secondary | ICD-10-CM | POA: Insufficient documentation

## 2021-08-08 DIAGNOSIS — X58XXXA Exposure to other specified factors, initial encounter: Secondary | ICD-10-CM | POA: Insufficient documentation

## 2021-08-08 DIAGNOSIS — S81801A Unspecified open wound, right lower leg, initial encounter: Secondary | ICD-10-CM | POA: Diagnosis not present

## 2021-08-08 DIAGNOSIS — L97929 Non-pressure chronic ulcer of unspecified part of left lower leg with unspecified severity: Secondary | ICD-10-CM

## 2021-08-08 NOTE — ED Notes (Signed)
Pt has extensive wounds on bilateral LE Lymphedema bilateral legs Chronic pain  Seen at [facility] on 2/24 Has wound care center appt tomorrow  Took oxycodone at 7 pm

## 2021-08-09 LAB — BASIC METABOLIC PANEL
Anion gap: 6 (ref 5–15)
BUN: 9 mg/dL (ref 6–20)
CO2: 27 mmol/L (ref 22–32)
Calcium: 9 mg/dL (ref 8.9–10.3)
Chloride: 102 mmol/L (ref 98–111)
Creatinine, Ser: 0.53 mg/dL (ref 0.44–1.00)
GFR, Estimated: >60 mL/min (ref >60)
Glucose, Bld: 89 mg/dL (ref 70–99)
Potassium: 4.1 mmol/L (ref 3.5–5.1)
Sodium: 135 mmol/L (ref 135–145)

## 2021-08-09 LAB — CBC WITH DIFFERENTIAL/PLATELET
Abs Immature Granulocytes: 0.02 10*3/uL (ref 0.00–0.07)
Basophils Absolute: 0 10*3/uL (ref 0.0–0.1)
Basophils Relative: 0 %
Eosinophils Absolute: 0.3 10*3/uL (ref 0.0–0.5)
Eosinophils Relative: 4 %
HCT: 27.4 % — ABNORMAL LOW (ref 36.0–46.0)
Hemoglobin: 8 g/dL — ABNORMAL LOW (ref 12.0–15.0)
Immature Granulocytes: 0 %
Lymphocytes Relative: 22 %
Lymphs Abs: 1.7 10*3/uL (ref 0.7–4.0)
MCH: 19.5 pg — ABNORMAL LOW (ref 26.0–34.0)
MCHC: 29.2 g/dL — ABNORMAL LOW (ref 30.0–36.0)
MCV: 66.8 fL — ABNORMAL LOW (ref 80.0–100.0)
Monocytes Absolute: 0.7 10*3/uL (ref 0.1–1.0)
Monocytes Relative: 9 %
Neutro Abs: 5.1 10*3/uL (ref 1.7–7.7)
Neutrophils Relative %: 65 %
Platelets: 462 10*3/uL — ABNORMAL HIGH (ref 150–400)
RBC: 4.1 MIL/uL (ref 3.87–5.11)
RDW: 20.2 % — ABNORMAL HIGH (ref 11.5–15.5)
WBC: 7.8 10*3/uL (ref 4.0–10.5)
nRBC: 0 % (ref 0.0–0.2)

## 2021-08-09 LAB — LACTIC ACID, PLASMA: Lactic Acid, Venous: 0.7 mmol/L (ref 0.5–1.9)

## 2021-08-09 MED ORDER — HYDROMORPHONE HCL 1 MG/ML IJ SOLN
1.0000 mg | Freq: Once | INTRAMUSCULAR | Status: AC
  Administered 2021-08-09: 1 mg via INTRAVENOUS
  Filled 2021-08-09: qty 1

## 2021-08-09 NOTE — ED Notes (Signed)
Called [facility] for PTAR transportation, stated two others ahead of PT

## 2021-08-09 NOTE — ED Provider Notes (Signed)
[facility] EMERGENCY DEPT Provider Note   CSN: [id number] Arrival date & time: 08/08/21  2335     History  Chief Complaint  Patient presents with   Leg Pain    [name] [name] is a 31 y.o. female.  Patient is a 31 year old female with past medical history of lymphedema with chronic, nonhealing wounds to the backs of both lower legs.  Patient also has history of obesity, anemia, and chronic pain syndrome.  She presents by EMS with complaints of bilateral leg pain.  She describes severe pain to the wounds of the back of both of her legs.  This appears to be a chronic issue, but she states the pain is much worse.  She apparently ran out of her oxycodone that is prescribed for her by pain management.  She denies to me that she is having fevers or chills.  She does report worsening smell coming from the wounds.  The history is provided by the patient.      Home Medications Prior to Admission medications   Medication Sig Start Date End Date Taking? Authorizing Provider  docusate sodium (COLACE) 100 MG capsule Take 100 mg by mouth daily.    [provider]  ferrous gluconate (FERGON) 324 MG tablet Take 1 tablet (324 mg total) by mouth daily with breakfast. 10/02/20   Georgette Shell, MD  gabapentin (NEURONTIN) 600 MG tablet Take 600 mg by mouth 3 (three) times daily. 09/15/20   [provider]  ondansetron (ZOFRAN) 4 MG tablet Take 1 tablet (4 mg total) by mouth every 6 (six) hours as needed for nausea. 10/01/20   Georgette Shell, MD  oxyCODONE-acetaminophen (PERCOCET) 5-325 MG tablet Take 1 tablet by mouth every 8 (eight) hours as needed for moderate pain or severe pain. [id number]   Delora Fuel, MD  potassium chloride SA (KLOR-CON) 20 MEQ tablet Take 1 tablet (20 mEq total) by mouth 2 (two) times daily. [id number]   Delora Fuel, MD  silver sulfADIAZINE (SILVADENE) 1 % cream Apply 1 application topically daily. 07/21/21   Deatra Canter, Amjad, PA-C  tiZANidine  (ZANAFLEX) 4 MG tablet Take 4 mg by mouth at bedtime. 09/05/20   [provider]      Allergies    Silver    Review of Systems   Review of Systems  All other systems reviewed and are negative.  Physical Exam Updated Vital Signs BP (!) 158/96 (BP Location: Left Arm)    Pulse (!) 128    Temp 98.9 F (37.2 C) (Oral)    Resp (!) 24    SpO2 100%  Physical Exam Vitals and nursing note reviewed.  Constitutional:      General: She is not in acute distress.    Appearance: She is well-developed. She is not diaphoretic.  HENT:     Head: Normocephalic and atraumatic.  Cardiovascular:     Rate and Rhythm: Normal rate and regular rhythm.     Heart sounds: No murmur heard.   No friction rub. No gallop.  Pulmonary:     Effort: Pulmonary effort is normal. No respiratory distress.     Breath sounds: Normal breath sounds. No wheezing.  Abdominal:     General: Bowel sounds are normal. There is no distension.     Palpations: Abdomen is soft.     Tenderness: There is no abdominal tenderness.  Musculoskeletal:     Cervical back: Normal range of motion and neck supple.     Comments: Patient has marked lymphedema of  both lower extremities.  There are extensive open wounds to the backs of both lower legs.  Tissue is foul-smelling, but no obvious purulent drainage.  Skin:    General: Skin is warm and dry.  Neurological:     General: No focal deficit present.     Mental Status: She is alert and oriented to person, place, and time.        ED Results / Procedures / Treatments   Labs (all labs ordered are listed, but only abnormal results are displayed) Labs Reviewed - No data to display  EKG None  Radiology No results found.  Procedures Procedures    Medications Ordered in ED Medications  HYDROmorphone (DILAUDID) injection 1 mg (has no administration in time range)    ED Course/ Medical Decision Making/ A&P  Patient with history of marked lymphedema of both lower  extremities with nonhealing wounds.  She presents with complaints of worsening pain to her lower legs.  Patient is afebrile and laboratory studies revealed no white count and normal lactate.  Patient's pain was treated here in the ER and she seems to be feeling significantly improved.  At this point, I feel as though patient can safely be discharged.  She has an appointment upcoming today with her primary doctor to discuss home wound care.    Final Clinical Impression(s) / ED Diagnoses Final diagnoses:  None    Rx / DC Orders ED Discharge Orders     None         Veryl Speak, MD 08/09/21 [phone]

## 2021-08-09 NOTE — Discharge Instructions (Signed)
Continue wound care as previously recommended.  Follow-up with your primary doctor and pain management specialist as previously arranged.

## 2021-09-27 ENCOUNTER — Encounter (HOSPITAL_BASED_OUTPATIENT_CLINIC_OR_DEPARTMENT_OTHER): Payer: Medicaid Other | Attending: Internal Medicine | Admitting: Internal Medicine

## 2021-09-27 DIAGNOSIS — I10 Essential (primary) hypertension: Secondary | ICD-10-CM | POA: Insufficient documentation

## 2021-09-27 DIAGNOSIS — L97818 Non-pressure chronic ulcer of other part of right lower leg with other specified severity: Secondary | ICD-10-CM | POA: Insufficient documentation

## 2021-09-27 DIAGNOSIS — L97822 Non-pressure chronic ulcer of other part of left lower leg with fat layer exposed: Secondary | ICD-10-CM | POA: Diagnosis not present

## 2021-09-27 DIAGNOSIS — E11622 Type 2 diabetes mellitus with other skin ulcer: Secondary | ICD-10-CM | POA: Diagnosis not present

## 2021-09-27 DIAGNOSIS — Z6836 Body mass index (BMI) 36.0-36.9, adult: Secondary | ICD-10-CM | POA: Diagnosis not present

## 2021-09-27 DIAGNOSIS — I890 Lymphedema, not elsewhere classified: Secondary | ICD-10-CM | POA: Insufficient documentation

## 2021-09-27 NOTE — Progress Notes (Addendum)
[name], [name] ([id number]) Visit Report for 09/27/2021 Allergy List Details Patient Name: Date of Service: [name] 1:15 PM Medical Record Number: [id number] Patient Account Number: [id number] Date of Birth/Sex: Treating RN: 06/03/1991 (31 y.o. [name] [name]) [name] [name] Primary CarCleon Gustine Bertie [name]: SYSTEM, PRO Rodena GoldmannV IDER Other Clinician: Referring Ellin Fitzgibbons: Treating [name] [name]: [name] [name] Weeks in [name]: 0 Allergies Active Allergies silver Reaction: dermatitis Allergy Notes Electronic Signature(s) Signed: 09/27/2021 4:19:15 PM By: [name] [name] Entered By: [name] [name] on 09/27/2021 13:43:55 -------------------------------------------------------------------------------- Arrival Information Details Patient Name: Date of Service: [name] [name] N, [place] 09/27/2021 1:15 PM Medical Record Number: [id number] Patient Account Number: [id number] Date of Birth/Sex: Treating RN: 07/22/1990 (31 y.o. [name] [name]) [name] [name] Primary Care [name] [name]: SYSTEM, PRO Rodena GoldmannV IDER Other Clinician: Referring Stephen Turnbaugh: Treating Aimi Essner/Extender: [name] [name] Weeks in [name]: 0 Visit Information Patient Arrived: Walker Arrival Time: 13:39 Transfer Assistance: Manual Patient Identification Verified: Yes Secondary Verification Process Completed: Yes Patient Requires Transmission-Based Precautions: No Patient Has Alerts: No Electronic Signature(s) Signed: 09/27/2021 4:19:15 PM By: [name] [name] Entered By: [name] [name] on 09/27/2021 13:43:26 -------------------------------------------------------------------------------- Clinic Level of Care Assessment Details Patient Name: Date of Service: [name] [name] [facility] [facility] 09[facility]27/2021 1:15 PM Medical Record Number: [id number] Patient Account Number: [id number] Date of Birth/Sex: Treating RN: 06/06/1991 (31 y.o. [name] [name]) [name] [name] Primary Care Doll Frazee: SYSTEM, PRO Rodena GoldmannV IDER Other Clinician: Referring  [name] [name]: Treating Kaushal Vannice/Extender: [name] [name] Weeks in [name]: 0 Clinic Level of Care Assessment Items TOOL 1 Quantity Score X- 1 0 Use when EandM and Procedure is performed on INITIAL visit ASSESSMENTS - Nursing Assessment / Reassessment X- 1 20 General Physical Exam (combine w/ comprehensive assessment (listed just below) when performed on new pt. evals) X- 1 25 Comprehensive Assessment (HX, ROS, Risk Assessments, Wounds Hx, etc.) ASSESSMENTS - Wound and Skin Assessment / Reassessment []  - 0 Dermatologic / Skin Assessment (not related to wound area) ASSESSMENTS - Ostomy and/or Continence Assessment and Care []  - 0 Incontinence Assessment and Management []  - 0 Ostomy Care Assessment and Management (repouching, etc.) PROCESS - Coordination of Care []  - 0 Simple Patient / Family Education for ongoing care X- 1 20 Complex (extensive) Patient / Family Education for ongoing care X- 1 10 Staff obtains ChiropractorConsents, Records, T Results / Process Orders est X- 1 10 Staff telephones HHA, Nursing Homes / Clarify orders / etc []  - 0 Routine Transfer to another Facility (non-emergent condition) []  - 0 Routine Hospital Admission (non-emergent condition) []  - 0 New Admissions / Manufacturing engineernsurance Authorizations / Ordering NPWT Apligraf, etc. , []  - 0 Emergency Hospital Admission (emergent condition) PROCESS - Special Needs []  - 0 Pediatric / Minor Patient Management []  - 0 Isolation Patient Management []  - 0 Hearing / Language / Visual special needs []  - 0 Assessment of Community assistance (transportation, D/C planning, etc.) []  - 0 Additional assistance / Altered mentation []  - 0 Support Surface(s) Assessment (bed, cushion, seat, etc.) INTERVENTIONS - Miscellaneous []  - 0 External ear exam []  - 0 Patient Transfer (multiple staff / Nurse, adultHoyer Lift / Similar devices) []  - 0 Simple Staple / Suture removal (25 or less) []  - 0 Complex Staple / Suture removal  (26 or more) []  - 0 Hypo/Hyperglycemic Management (do not check if billed separately) []  - 0 Ankle / Brachial Index (ABI) - do not check if billed separately Has the patient been seen at the hospital within the last three years: Yes Total Score: 85  Level Of Care: New/Established - Level 3 Electronic Signature(s) Signed: 09/27/2021 4:19:15 PM By: [name] Iba Entered By: [name] Iba on 09/27/2021 15:01:12 -------------------------------------------------------------------------------- Compression Therapy Details Patient Name: Date of Service: [name] [name] [place] 09/27/2021 1:15 PM Medical Record Number: [id number] Patient Account Number: [id number] Date of Birth/Sex: Treating RN: 01-Jun-1991 (31 y.o. [name] Cluck Primary Care Tomeshia Pizzi: SYSTEM, PRO Rodena Goldmann Other Clinician: Referring Eastyn Dattilo: Treating Rameen Gohlke/Extender: [name] Joseph, A [name] Weeks in [name]: 0 Compression Therapy Performed for Wound Assessment: Wound #1 Left,Posterior Lower Leg Performed By: Clinician [name] Iba, RN Compression Type: Four Layer Post Procedure Diagnosis Same as Pre-procedure Electronic Signature(s) Signed: 09/27/2021 4:19:15 PM By: [name] Iba Entered By: [name] Iba on 09/27/2021 14:35:37 -------------------------------------------------------------------------------- Compression Therapy Details Patient Name: Date of Service: [name] [name] [place] 09/27/2021 1:15 PM Medical Record Number: [id number] Patient Account Number: [id number] Date of Birth/Sex: Treating RN: December 16, 1990 (31 y.o. [name] Cluck Primary Care Jakeria Caissie: SYSTEM, PRO Rodena Goldmann Other Clinician: Referring Ibrahem Volkman: Treating Lashon Hillier/Extender: [name] Joseph, A [name] Weeks in [name]: 0 Compression Therapy Performed for Wound Assessment: Wound #2 Left,Posterior Lower Leg Performed By: Clinician [name] Iba, RN Compression Type: Four Layer Post Procedure Diagnosis Same as  Pre-procedure Electronic Signature(s) Signed: 09/27/2021 4:19:15 PM By: [name] Iba Entered By: [name] Iba on 09/27/2021 14:35:37 -------------------------------------------------------------------------------- Encounter Discharge Information Details Patient Name: Date of Service: [name] [name] [place] 09/27/2021 1:15 PM Medical Record Number: [id number] Patient Account Number: [id number] Date of Birth/Sex: Treating RN: [birth year] (31 y.o. [name] Cluck Primary Care Tariah Transue: SYSTEM, PRO Rodena Goldmann Other Clinician: Referring Loretta Doutt: Treating Hartwell Vandiver/Extender: [name] Joseph, A [name] Weeks in [name]: 0 Encounter Discharge Information Items Discharge Condition: Stable Ambulatory Status: Walker Discharge Destination: Home Transportation: Other Schedule Follow-up Appointment: Yes Clinical Summary of Care: Provided on 09/27/2021 Form Type Recipient Paper Patient Patient Electronic Signature(s) Signed: 09/27/2021 4:04:11 PM By: [name] Iba Entered By: [name] Iba on 09/27/2021 16:04:11 -------------------------------------------------------------------------------- Lower Extremity Assessment Details Patient Name: Date of Service: [name] [name] [facility] 09/27/2021 1:15 PM Medical Record Number: [id number] Patient Account Number: [id number] Date of Birth/Sex: Treating RN: 1991/06/05 (31 y.o. [name] Cluck Primary Care Mariamawit Depaoli: SYSTEM, PRO V IDER Other Clinician: Referring Kayleigh Broadwell: Treating Colman Birdwell/Extender: [name] Joseph, A [name] Weeks in [name]: 0 Edema Assessment Assessed: [Left: Yes] [Right: Yes] Edema: [Left: Yes] [Right: Yes] Calf Left: Right: Point of Measurement: 28 cm From Medial Instep 79 cm 74.5 cm Ankle Left: Right: Point of Measurement: 6 cm From Medial Instep 52 cm 55 cm Knee To Floor Left: Right: From Medial Instep 38 cm 38 cm Vascular Assessment Pulses: Dorsalis Pedis Palpable: [Left:No Inaudible] [Right:No  Inaudible] Electronic Signature(s) Signed: 09/27/2021 4:19:15 PM By: [name] Iba Entered By: [name] Iba on 09/27/2021 14:04:29 -------------------------------------------------------------------------------- Multi Wound Chart Details Patient Name: Date of Service: [name] [name] [place] 09/27/2021 1:15 PM Medical Record Number: [id number] Patient Account Number: [id number] Date of Birth/Sex: Treating RN: [birth year] (31 y.o. [name] Cluck Primary Care Jaeliana Lococo: SYSTEM, PRO Rodena Goldmann Other Clinician: Referring Janell Keeling: Treating Parmvir Boomer/Extender: [name] Joseph, A [name] Weeks in [name]: 0 Vital Signs Height(in): 67 Pulse(bpm): 48 Weight(lbs): 231 Blood Pressure(mmHg): 105/68 Body Mass Index(BMI): 36.2 Temperature(F): 97.7 Respiratory Rate(breaths/min): 18 Photos: [N/A:N/A] Left, Posterior Lower Leg Left, Posterior Lower Leg N/A Wound Location: Gradually Appeared Gradually Appeared N/A Wounding Event: Lymphedema Lymphedema N/A Primary Etiology: Lymphedema, Hypertension, Lymphedema, Hypertension, N/A Comorbid History: Peripheral Venous Disease, Type II Peripheral Venous Disease, Type II Diabetes,  Osteoarthritis Diabetes, Osteoarthritis 09/23/2019 09/24/2019 N/A Date Acquired: 0 0 N/A Weeks of [name]: Open Open N/A Wound Status: No No N/A Wound Recurrence: 12x28x0.2 13x17.5x0.2 N/A Measurements L x W x D (cm) 263.894 178.678 N/A A (cm) : rea 52.779 35.736 N/A Volume (cm) : 0.00% 0.00% N/A % Reduction in Area: 0.00% 0.00% N/A % Reduction in Volume: Full Thickness Without Exposed Full Thickness Without Exposed N/A Classification: Support Structures Support Structures Large Medium N/A Exudate Amount: Serosanguineous Serosanguineous N/A Exudate Type: red, brown red, brown N/A Exudate Color: Distinct, outline attached Distinct, outline attached N/A Wound Margin: Medium (34-66%) Medium (34-66%) N/A Granulation Amount: Red, Pink Red, Pink  N/A Granulation Quality: Medium (34-66%) Medium (34-66%) N/A Necrotic Amount: Fat Layer (Subcutaneous Tissue): Yes Fat Layer (Subcutaneous Tissue): Yes N/A Exposed Structures: Fascia: No Fascia: No Tendon: No Tendon: No Muscle: No Muscle: No Joint: No Joint: No Bone: No Bone: No None None N/A Epithelialization: Compression Therapy Compression Therapy N/A Procedures Performed: [name] Notes Electronic Signature(s) Signed: 09/27/2021 3:52:38 PM By: Baltazar Najjar MD Signed: 09/27/2021 4:19:15 PM By: [name] Iba Entered By: Baltazar Najjar on 09/27/2021 14:51:53 -------------------------------------------------------------------------------- Multi-Disciplinary Care Plan Details Patient Name: Date of Service: [name] [name] [place] 09/27/2021 1:15 PM Medical Record Number: [id number] Patient Account Number: [id number] Date of Birth/Sex: Treating RN: January 18, 1991 (31 y.o. [name] Cluck Primary Care Geraldean Walen: SYSTEM, PRO Rodena Goldmann Other Clinician: Referring Lyman Balingit: Treating Delio Slates/Extender: [name] Joseph, A [name] Weeks in [name]: 0 Active Inactive Electronic Signature(s) Signed: 11/04/2021 2:34:49 PM By: [name] Iba Previous Signature: 09/27/2021 4:19:15 PM Version By: [name] Iba Entered By: [name] Iba on 11/04/2021 14:34:49 -------------------------------------------------------------------------------- Pain Assessment Details Patient Name: Date of Service: [name] [name] [place] 09/27/2021 1:15 PM Medical Record Number: [id number] Patient Account Number: [id number] Date of Birth/Sex: Treating RN: November 17, 1990 (31 y.o. [name] Cluck Primary Care Yasheka Fossett: SYSTEM, PRO Rodena Goldmann Other Clinician: Referring Plumer Mittelstaedt: Treating Ilanna Deihl/Extender: [name] Joseph, A [name] Weeks in [name]: 0 Active Problems Location of Pain Severity and Description of Pain Patient Has Paino Yes Site Locations Pain Location: Pain in Ulcers With Dressing  Change: Yes Duration of the Pain. Constant / Intermittento Intermittent Rate the pain. Current Pain Level: 8 Character of Pain Describe the Pain: Aching, Tender, Throbbing Pain Management and Medication Current Pain Management: Medication: Yes Cold Application: No Rest: Yes Massage: No Activity: No T.E.N.S.: No Heat Application: No Leg drop or elevation: No Is the Current Pain Management Adequate: Adequate How does your wound impact your activities of daily livingo Sleep: Yes Bathing: No Appetite: No Relationship With Others: No Bladder Continence: No Emotions: No Bowel Continence: No Work: No Toileting: No Drive: No Dressing: No Hobbies: No Electronic Signature(s) Signed: 09/27/2021 4:19:15 PM By: [name] Iba Entered By: [name] Iba on 09/27/2021 13:54:07 -------------------------------------------------------------------------------- Patient/Caregiver Education Details Patient Name: Date of Service: [name] [name] [place] 4/17/2023andnbsp1:15 PM Medical Record Number: [id number] Patient Account Number: [id number] Date of Birth/Gender: Treating RN: Nov 09, 1990 (31 y.o. [name] Cluck Primary Care Physician: SYSTEM, PRO V IDER Other Clinician: Referring Physician: Treating Physician/Extender: [name] Joseph, A [name] Weeks in [name]: 0 Education Assessment Education Provided To: Patient Education Topics Provided Wound/Skin Impairment: Methods: Demonstration, Explain/Verbal, Printed Responses: State content correctly Electronic Signature(s) Signed: 09/27/2021 4:19:15 PM By: [name] Iba Entered By: [name] Iba on 09/27/2021 14:14:19 -------------------------------------------------------------------------------- Wound Assessment Details Patient Name: Date of Service: [name] [name] [place] 09/27/2021 1:15 PM Medical Record Number: [id number] Patient Account Number: [id number] Date of Birth/Sex: Treating  RN: August 04, 1990 (31 y.o. [name] Cluck Primary Care Lorisa Scheid: SYSTEM, PRO V IDER Other Clinician: Referring Mikelle Myrick: Treating Chrishaun Sasso/Extender: [name] Joseph, A [name] Weeks in [name]: 0 Wound Status Wound Number: 1 Primary Lymphedema Etiology: Wound Location: Left, Posterior Lower Leg Wound Open Wounding Event: Gradually Appeared Status: Date Acquired: 09/23/2019 Comorbid Lymphedema, Hypertension, Peripheral Venous Disease, Type Weeks Of [name]: 0 History: II Diabetes, Osteoarthritis Clustered Wound: No Photos Wound Measurements Length: (cm) 12 Width: (cm) 28 Depth: (cm) 0.2 Area: (cm) 263.894 Volume: (cm) 52.779 % Reduction in Area: 0% % Reduction in Volume: 0% Epithelialization: None Tunneling: No Undermining: No Wound Description Classification: Full Thickness Without Exposed Support Structures Wound Margin: Distinct, outline attached Exudate Amount: Large Exudate Type: Serosanguineous Exudate Color: red, brown Foul Odor After Cleansing: No Slough/Fibrino Yes Wound Bed Granulation Amount: Medium (34-66%) Exposed Structure Granulation Quality: Red, Pink Fascia Exposed: No Necrotic Amount: Medium (34-66%) Fat Layer (Subcutaneous Tissue) Exposed: Yes Necrotic Quality: Adherent Slough Tendon Exposed: No Muscle Exposed: No Joint Exposed: No Bone Exposed: No Electronic Signature(s) Signed: 09/27/2021 4:19:15 PM By: [name] Iba Entered By: [name] Iba on 09/27/2021 14:19:40 -------------------------------------------------------------------------------- Wound Assessment Details Patient Name: Date of Service: [name] [name] [place] 09/27/2021 1:15 PM Medical Record Number: [id number] Patient Account Number: [id number] Date of Birth/Sex: Treating RN: 08/25/1990 (31 y.o. [name] Cluck Primary Care Cloyce Paterson: SYSTEM, PRO V IDER Other Clinician: Referring Boen Sterbenz: Treating Rylyn Ranganathan/Extender: [name] Joseph, A [name] Weeks in [name]: 0 Wound Status Wound  Number: 2 Primary Lymphedema Etiology: Wound Location: Left, Posterior Lower Leg Wound Open Wounding Event: Gradually Appeared Status: Date Acquired: 09/24/2019 Comorbid Lymphedema, Hypertension, Peripheral Venous Disease, Type Weeks Of [name]: 0 History: II Diabetes, Osteoarthritis Clustered Wound: No Photos Wound Measurements Length: (cm) 13 Width: (cm) 17.5 Depth: (cm) 0.2 Area: (cm) 178.678 Volume: (cm) 35.736 % Reduction in Area: 0% % Reduction in Volume: 0% Epithelialization: None Tunneling: No Undermining: No Wound Description Classification: Full Thickness Without Exposed Support Structures Wound Margin: Distinct, outline attached Exudate Amount: Medium Exudate Type: Serosanguineous Exudate Color: red, brown Foul Odor After Cleansing: No Slough/Fibrino Yes Wound Bed Granulation Amount: Medium (34-66%) Exposed Structure Granulation Quality: Red, Pink Fascia Exposed: No Necrotic Amount: Medium (34-66%) Fat Layer (Subcutaneous Tissue) Exposed: Yes Necrotic Quality: Adherent Slough Tendon Exposed: No Muscle Exposed: No Joint Exposed: No Bone Exposed: No Electronic Signature(s) Signed: 09/27/2021 4:19:15 PM By: [name] Iba Entered By: [name] Iba on 09/27/2021 14:20:21 -------------------------------------------------------------------------------- Vitals Details Patient Name: Date of Service: [name] [name] [place] 09/27/2021 1:15 PM Medical Record Number: [id number] Patient Account Number: [id number] Date of Birth/Sex: Treating RN: 1991-03-30 (31 y.o. [name] Cluck Primary Care Devontaye Ground: SYSTEM, PRO V IDER Other Clinician: Referring Gustave Lindeman: Treating Aizen Duval/Extender: [name] Joseph, A [name] Weeks in [name]: 0 Vital Signs Time Taken: 13:46 Temperature (F): 97.7 Height (in): 67 Pulse (bpm): 48 Source: Stated Respiratory Rate (breaths/min): 18 Weight (lbs): 231 Blood Pressure (mmHg): 105/68 Source: Stated Reference  Range: 80 - 120 mg / dl Body Mass Index (BMI): 36.2 Electronic Signature(s) Signed: 09/27/2021 4:19:15 PM By: [name] Iba Entered By: [name] Iba on 09/27/2021 13:48:04

## 2021-09-27 NOTE — Progress Notes (Signed)
[name], [name] ([id number]) ?Visit Report for 09/27/2021 ?Abuse Risk Screen Details ?Patient Name: Date of Service: ?[name] [name] [name] 09/27/2021 1:15 PM ?Medical Record Number: [id number] ?Patient Account Number: [id number] ?Date of Birth/Sex: Treating RN: ?January 02, 1991 (31 y.o. [name] [name] ?Primary Care [name] [name]: SYSTEM, PRO V IDER Other Clinician: ?Referring [name] [name]: ?Treating [name] [name]/[name] [name] ?[name] [name] ?Weeks in Treatment: 0 ?Abuse Risk Screen Items ?Answer ?ABUSE RISK SCREEN: ?Has anyone close to you tried to hurt or harm you recentlyo No ?Do you feel uncomfortable with anyone in your familyo No ?Has anyone forced you do things that you didnt want to doo No ?Electronic Signature(s) ?Signed: 09/27/2021 4:19:15 PM By: Antonieta Iba ?Entered By: Antonieta Iba on 09/27/2021 13:51:51 ?-------------------------------------------------------------------------------- ?Activities of Daily Living Details ?Patient Name: Date of Service: ?[name] [name] [facility] 09/27/2021 1:15 PM ?Medical Record Number: [id number] ?Patient Account Number: [id number] ?Date of Birth/Sex: Treating RN: ?Apr 04, 1991 (31 y.o. [name] [name] ?Primary Care [name] [name]: SYSTEM, PRO V IDER Other Clinician: ?Referring Hermena Swint: ?Treating [name] [name]/[name] [name] ?[name] [name] ?Weeks in Treatment: 0 ?Activities of Daily Living Items ?Answer ?Activities of Daily Living (Please select one for each item) ?Drive Automobile Need Assistance ?T Medications ?ake Completely Able ?Use T elephone Completely Able ?Care for Appearance Completely Able ?Use T oilet Completely Able ?Bath / Shower Completely Able ?Dress Self Completely Able ?Feed Self Completely Able ?Walk Need Assistance ?Get In / Out Bed Completely Able ?Housework Completely Able ?Prepare Meals Completely Able ?Handle Money Completely Able ?Shop for Self Completely Able ?Electronic Signature(s) ?Signed: 09/27/2021 4:19:15 PM By: Antonieta Iba ?Entered By:  Antonieta Iba on 09/27/2021 13:52:22 ?-------------------------------------------------------------------------------- ?Education Screening Details ?Patient Name: ?Date of Service: ?[name] [name] [name] [name] [name] [name] 09/27/2021 1:15 PM ?Medical Record Number: [id number] ?Patient Account Number: [id number] ?Date of Birth/Sex: ?Treating RN: ?Apr 20, 1991 (31 y.o. [name] [name] ?Primary Care [name] [name]: SYSTEM, PRO V IDER ?Other Clinician: ?Referring [name] [name]: ?Treating [name] [name]/[name] [name] ?[name] [name] ?Weeks in Treatment: 0 ?Primary Learner Assessed: Patient ?Learning Preferences/Education Level/Primary Language ?Learning Preference: Explanation, Demonstration, Printed Material ?Highest Education Level: High School ?Preferred Language: English ?Cognitive Barrier ?Language Barrier: No ?Translator Needed: No ?Memory Deficit: No ?Emotional Barrier: No ?Cultural/Religious Beliefs Affecting Medical Care: No ?Physical Barrier ?Impaired Vision: Yes Glasses ?Impaired Hearing: No ?Decreased Hand dexterity: No ?Knowledge/Comprehension ?Knowledge Level: Medium ?Comprehension Level: Medium ?Ability to understand written instructions: Medium ?Ability to understand verbal instructions: Medium ?Motivation ?Anxiety Level: Anxious ?Cooperation: Cooperative ?Education Importance: Acknowledges Need ?Interest in Health Problems: Asks Questions ?Perception: Coherent ?Willingness to Engage in Self-Management Medium ?Activities: ?Readiness to Engage in Self-Management Medium ?Activities: ?Electronic Signature(s) ?Signed: 09/27/2021 4:19:15 PM By: Antonieta Iba ?Entered By: Antonieta Iba on 09/27/2021 13:52:58 ?-------------------------------------------------------------------------------- ?Fall Risk Assessment Details ?Patient Name: ?Date of Service: ?[name] [name] [facility] 09/27/2021 1:15 PM ?Medical Record Number: [id number] ?Patient Account Number: [id number] ?Date of Birth/Sex: ?Treating RN: ?Jan 07, 1991 (31 y.o. [name] [name] ?Primary  Care [name] [name]: SYSTEM, PRO V IDER ?Other Clinician: ?Referring [name] [name]: ?Treating [name] [name]/[name] [name] ?[name] [name] ?Weeks in Treatment: 0 ?Fall Risk Assessment Items ?Have you had 2 or more falls in the last 12 monthso 0 No ?Have you had any fall that resulted in injury in the last 12 monthso 0 No ?FALLS RISK SCREEN ?History of falling - immediate or within 3 months 0 No ?Secondary diagnosis (Do you have 2 or more medical diagnoseso) 15 Yes ?Ambulatory aid ?None/bed rest/wheelchair/nurse 0 No ?Crutches/cane/walker 15 Yes ?Furniture 0 No ?Intravenous therapy Access/Saline/Heparin  Lock 0 No ?Gait/Transferring ?Normal/ bed rest/ wheelchair 0 Yes ?Weak (short steps with or without shuffle, stooped but able to lift head while walking, may seek 0 No ?support from furniture) ?Impaired (short steps with shuffle, may have difficulty arising from chair, head down, impaired 0 No ?balance) ?Mental Status ?Oriented to own ability 0 Yes ?Electronic Signature(s) ?Signed: 09/27/2021 4:19:15 PM By: Antonieta Iba ?Entered By: Antonieta Iba on 09/27/2021 13:53:13 ?-------------------------------------------------------------------------------- ?Foot Assessment Details ?Patient Name: ?Date of Service: ?[name] [name] [name] [name] 09/27/2021 1:15 PM ?Medical Record Number: [id number] ?Patient Account Number: [id number] ?Date of Birth/Sex: ?Treating RN: ?1990/10/31 (31 y.o. [name] [name] ?Primary Care [name] [name]: SYSTEM, PRO V IDER ?Other Clinician: ?Referring [name] [name]: ?Treating [name] [name]/[name] [name] ?[name] [name] ?Weeks in Treatment: 0 ?Foot Assessment Items ?Site Locations ?+ = Sensation present, - = Sensation absent, C = Callus, U = Ulcer ?R = Redness, W = Warmth, M = Maceration, PU = Pre-ulcerative lesion ?F = Fissure, S = Swelling, D = Dryness ?Assessment ?Right: Left: ?Other Deformity: No No ?Prior Foot Ulcer: No No ?Prior Amputation: No No ?Charcot Joint: No No ?Ambulatory Status: Ambulatory With  Help ?Assistance Device: Walker ?Gait: Steady ?Electronic Signature(s) ?Signed: 09/27/2021 4:19:15 PM By: Antonieta Iba ?Entered By: Antonieta Iba on 09/27/2021 13:57:42 ?-------------------------------------------------------------------------------- ?Nutrition Risk Screening Details ?Patient Name: ?Date of Service: ?[name] [name] [facility] 09/27/2021 1:15 PM ?Medical Record Number: [id number] ?Patient Account Number: [id number] ?Date of Birth/Sex: ?Treating RN: ?Jun 04, 1991 (31 y.o. [name] [name] ?Primary Care Emerald Shor: SYSTEM, PRO V IDER ?Other Clinician: ?Referring Hagan Maltz: ?Treating Shaheed Schmuck/[name] [name] ?[name] [name] ?Weeks in Treatment: 0 ?Height (in): 67 ?Weight (lbs): 231 ?Body Mass Index (BMI): 36.2 ?Nutrition Risk Screening Items ?Score Screening ?NUTRITION RISK SCREEN: ?I have an illness or condition that made me change the kind and/or amount of food I eat 0 No ?I eat fewer than two meals per day 0 No ?I eat few fruits and vegetables, or milk products 0 No ?I have three or more drinks of beer, liquor or wine almost every day 0 No ?I have tooth or mouth problems that make it hard for me to eat 0 No ?I don't always have enough money to buy the food I need 0 No ?I eat alone most of the time 0 No ?I take three or more different prescribed or over-the-counter drugs a day 1 Yes ?Without wanting to, I have lost or gained 10 pounds in the last six months 0 No ?I am not always physically able to shop, cook and/or feed myself 0 No ?Nutrition Protocols ?Good Risk Protocol 0 No interventions needed ?Moderate Risk Protocol ?High Risk Proctocol ?Risk Level: Good Risk ?Score: 1 ?Electronic Signature(s) ?Signed: 09/27/2021 4:19:15 PM By: Antonieta Iba ?Entered By: Antonieta Iba on 09/27/2021 13:53:29 ?

## 2021-09-29 NOTE — Progress Notes (Signed)
SKYRAH, KRUPP ([id number]) ?Visit Report for 09/27/2021 ?Chief Complaint Document Details ?Patient Name: Date of Service: ?[name] [name] [facility] 09/27/2021 1:15 PM ?Medical Record Number: [id number] ?Patient Account Number: [id number] ?Date of Birth/Sex: Treating RN: ?Jul 11, 1990 (31 y.o. Roel Cluck ?Primary Care Provider: SYSTEM, PRO V IDER Other Clinician: ?Referring Provider: ?Treating Provider/Extender: Baltazar Najjar ?FA UCHEUX, A NDREW ?Weeks in Treatment: 0 ?Information Obtained from: Patient ?Chief Complaint ?09/27/2021; patient is here for review of bilateral lower extremity wounds in the setting of severe chronic lymphedema ?Electronic Signature(s) ?Signed: 09/27/2021 3:52:38 PM By: Baltazar Najjar MD ?Entered By: Baltazar Najjar on 09/27/2021 14:52:37 ?-------------------------------------------------------------------------------- ?HPI Details ?Patient Name: Date of Service: ?[name] [name] [facility] 09/27/2021 1:15 PM ?Medical Record Number: [id number] ?Patient Account Number: [id number] ?Date of Birth/Sex: Treating RN: ?02-Sep-1990 (31 y.o. Roel Cluck ?Primary Care Provider: SYSTEM, PRO V IDER Other Clinician: ?Referring Provider: ?Treating Provider/Extender: Baltazar Najjar ?FA UCHEUX, A NDREW ?Weeks in Treatment: 0 ?History of Present Illness ?HPI Description: ADMISSION ?/17/23 ?This is a 31 year old woman who tells me that she has had problems with swelling in her legs since her mid 3s. She carries a diagnosis of chronic ?lymphedema. She apparently spent time of both the [place] wound care center and at [facility] although I did not look that far back into the record on her. ?One of her nurses who spent time in wound care in [facility] remembers her. She has severe lymphedema in her lower legs with severe bilateral wounds ?wrapping around the back of her calf just above her ankles. She has compression pumps but apparently the sleeves are too small for her now. ?Past medical history includes lymphedema,  type 2 diabetes, innumerable trips to the emergency room, morbid obesity, prediabetes, hypertension, chronic ?pain, microcytic hypochromic anemia requiring transfusionso Blood loss from wounds according to the patient]. Patient had a CT scan of the abdomen and ?pelvis that showed a right ovarian cyst in 2021 with a large adnexal cyst and pelvic and inguinal adenopathy. I could not see that this was followed up on but ?the patient says that she was followed up and that the findings had resolved ?It would not be possible to check ABIs in this patient ?The patient has a SILVER ALLERGY. She also claims intolerance to alginate. She says she tolerated Aquacel even though this is an alginate. She does not ?tolerate Dakin's ?Electronic Signature(s) ?Signed: 09/27/2021 3:52:38 PM By: Baltazar Najjar MD ?Entered By: Baltazar Najjar on 09/27/2021 14:56:58 ?-------------------------------------------------------------------------------- ?Physical Exam Details ?Patient Name: Date of Service: ?[name] [name] [facility] 09/27/2021 1:15 PM ?Medical Record Number: [id number] ?Patient Account Number: [id number] ?Date of Birth/Sex: Treating RN: ?1991/05/14 (31 y.o. Roel Cluck ?Primary Care Provider: SYSTEM, PRO V IDER Other Clinician: ?Referring Provider: ?Treating Provider/Extender: Baltazar Najjar ?FA UCHEUX, A NDREW ?Weeks in Treatment: 0 ?Constitutional ?Sitting or standing Blood Pressure is within target range for patient.. Slightly bradycardic. Temperature is normal and within the target range for the patient.Marland Kitchen ?Appears in no distress. ?Respiratory ?work of breathing is normal. ?Cardiovascular ?Massive bilateral nonpitting edema below the knees. This extends somewhat into the thighs but much less. No other abnormalities are noted. ?Notes ?The patient has large wounds which extend posteriorly just above the ankle bilaterally from medial to lateral. These have considerable depth. Surface is not ?viable however she did not want a  debridement. Nor did I offer 1 considering the degree of edema in her legs. There would appear to be a lot of drainage but no ?active  infection ?Electronic Signature(s) ?Signed: 09/27/2021 3:52:38 PM By: Baltazar Najjar MD ?Entered By: Baltazar Najjar on 09/27/2021 15:00:20 ?-------------------------------------------------------------------------------- ?Physician Orders Details ?Patient Name: Date of Service: ?[name] [name] [facility] 09/27/2021 1:15 PM ?Medical Record Number: [id number] ?Patient Account Number: [id number] ?Date of Birth/Sex: Treating RN: ?[birth year] (31 y.o. Roel Cluck ?Primary Care Provider: SYSTEM, PRO V IDER Other Clinician: ?Referring Provider: ?Treating Provider/Extender: Baltazar Najjar ?FA UCHEUX, A NDREW ?Weeks in Treatment: 0 ?Verbal / Phone Orders: No ?Diagnosis Coding ?Follow-up Appointments ?ppointment in 1 week. - with Dr. Mikey Bussing Lennox Laity, Room 7) ?Return A ?Bathing/ Shower/ Hygiene ?May shower with protection but do not get wound dressing(s) wet. - May use cast protector from [facility], [facility] or [employer] ?Edema Control - Lymphedema / SCD / Other ?Lymphedema Pumps. Use Lymphedema pumps on leg(s) 2-3 times a day for 45-60 minutes. If wearing any wraps or hose, do not remove ?them. Continue exercising as instructed. - **Please bring in the name of company your pumps of came from** ?Elevate legs to the level of the heart or above for 30 minutes daily and/or when sitting, a frequency of: - throughout of the day ?Avoid standing for long periods of time. ?Additional Orders / Instructions ?Follow Nutritious Diet ?Home Health ?dmit to Home Health for wound care. May utilize formulary equivalent dressing for wound treatment orders unless otherwise specified. - ?A ?Will send referral to [facility]) HH. ?Dressing changes to be completed by Home Health on Monday / Wednesday / Friday except when patient has scheduled visit at Wound [facility]Care Center. ?Wound Treatment ?Wound #1 - Lower Leg Wound  Laterality: Left, Posterior ?Secondary Dressing: ABD Pad, 8x10 1 x Per Week/30 Days ?Discharge Instructions: Apply over primary dressing as directed. ?Secondary Dressing: Zetuvit Plus 4x8 in 1 x Per Week/30 Days ?Discharge Instructions: Apply over primary dressing as directed. ?Compression Wrap: FourPress (4 layer compression wrap) 1 x Per Week/30 Days ?Discharge Instructions: Apply four layer compression as directed. May also use Miliken CoFlex 2 layer compression system as ?alternative. ?Wound #2 - Lower Leg Wound Laterality: Left, Posterior ?Secondary Dressing: ABD Pad, 8x10 1 x Per Week/30 Days ?Discharge Instructions: Apply over primary dressing as directed. ?Compression Wrap: FourPress (4 layer compression wrap) 1 x Per Week/30 Days ?Discharge Instructions: Apply four layer compression as directed. May also use Miliken CoFlex 2 layer compression system as ?alternative. ?Electronic Signature(s) ?Signed: 09/27/2021 4:19:15 PM By: Antonieta Iba ?Signed: 09/29/2021 4:38:02 AM By: Baltazar Najjar MD ?Previous Signature: 09/27/2021 3:52:38 PM Version By: Baltazar Najjar MD ?Entered By: Antonieta Iba on 09/27/2021 16:17:46 ?-------------------------------------------------------------------------------- ?Problem List Details ?Patient Name: ?Date of Service: ?[name] [name] [facility] 09/27/2021 1:15 PM ?Medical Record Number: [id number] ?Patient Account Number: [id number] ?Date of Birth/Sex: ?Treating RN: ?28-Mar-1991 (31 y.o. 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

## 2021-10-05 ENCOUNTER — Encounter (HOSPITAL_BASED_OUTPATIENT_CLINIC_OR_DEPARTMENT_OTHER): Payer: Medicaid Other | Admitting: Internal Medicine

## 2021-10-12 ENCOUNTER — Encounter (HOSPITAL_BASED_OUTPATIENT_CLINIC_OR_DEPARTMENT_OTHER): Payer: Medicaid Other | Attending: Internal Medicine | Admitting: Internal Medicine

## 2021-10-23 IMAGING — DX DG TIBIA/FIBULA 2V*L*
4 series · 4 of 4 positions shown · non-contrast
Comparison: None.

CLINICAL DATA: Status post fall with a chronic left leg wound.

EXAM:
LEFT TIBIA AND FIBULA - 2 VIEW

[tibia ap (1 of 2)]
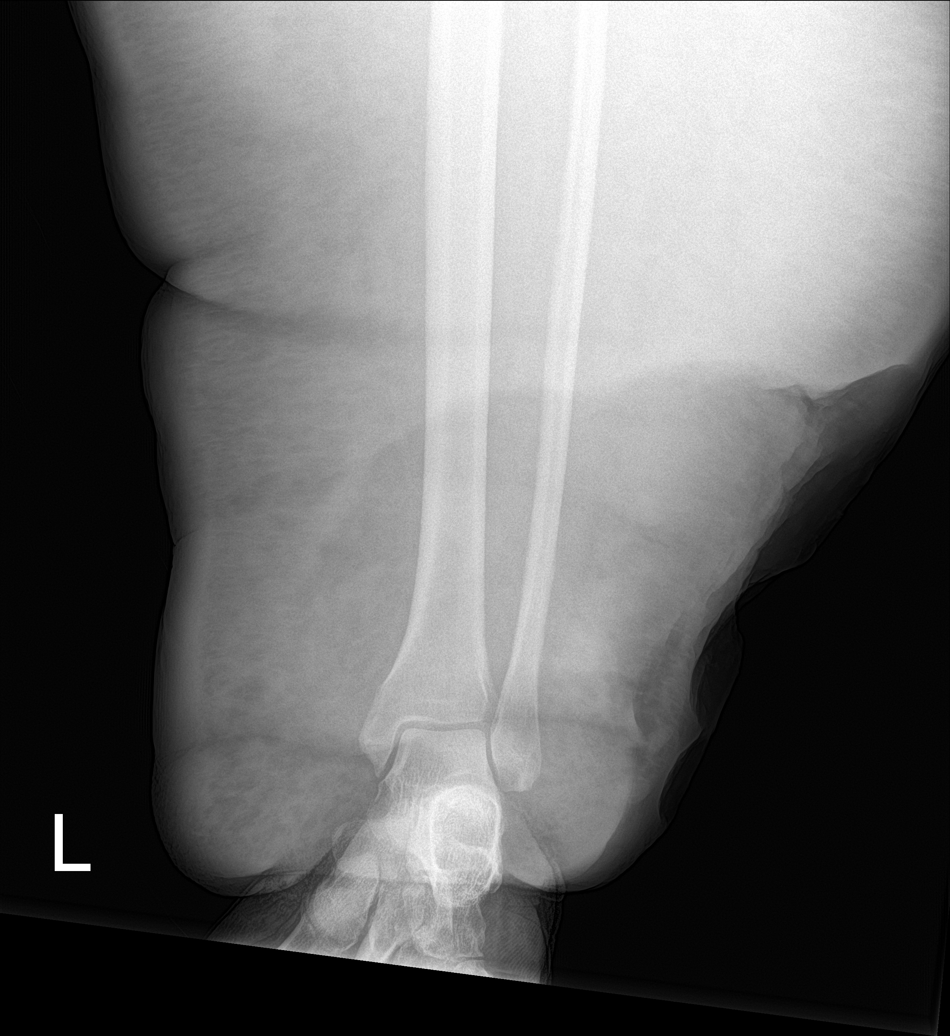

[tibia ap (2 of 2)]
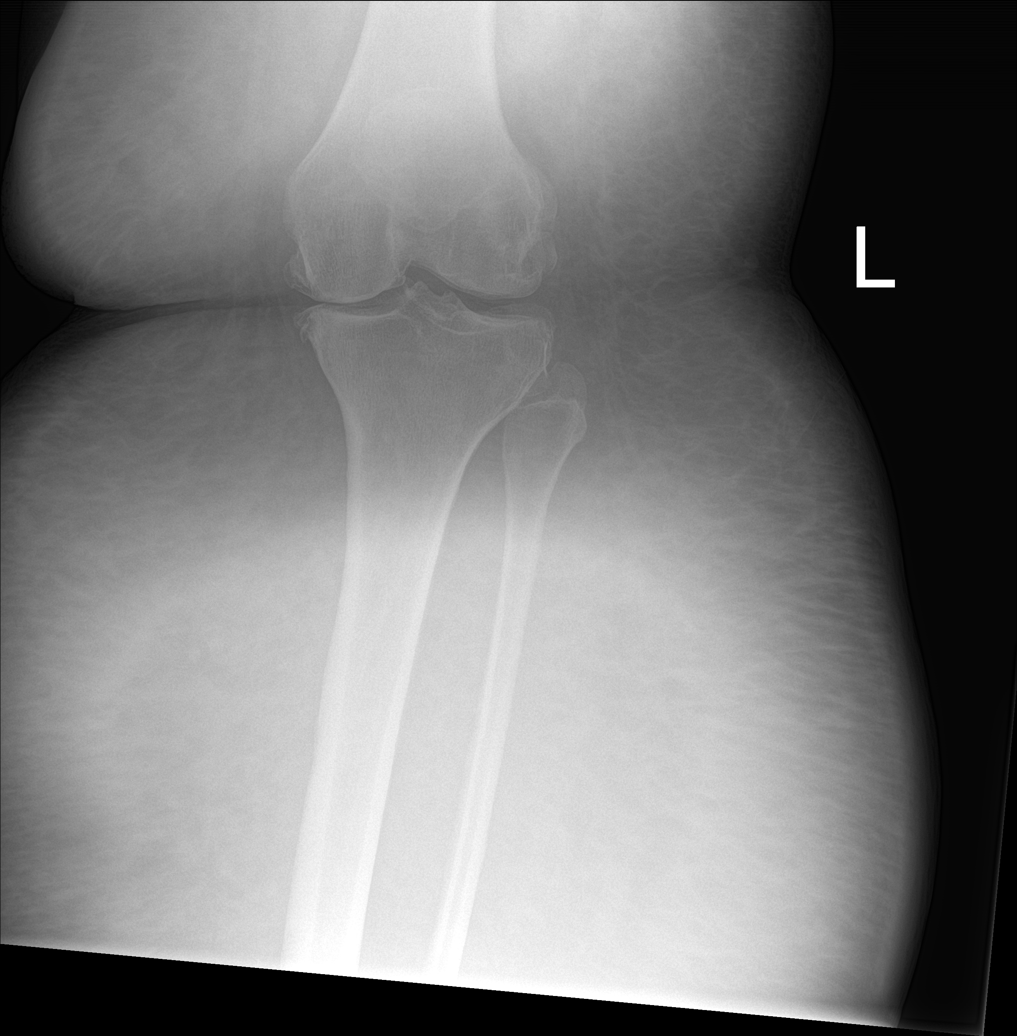

[tibia lat (1 of 2)]
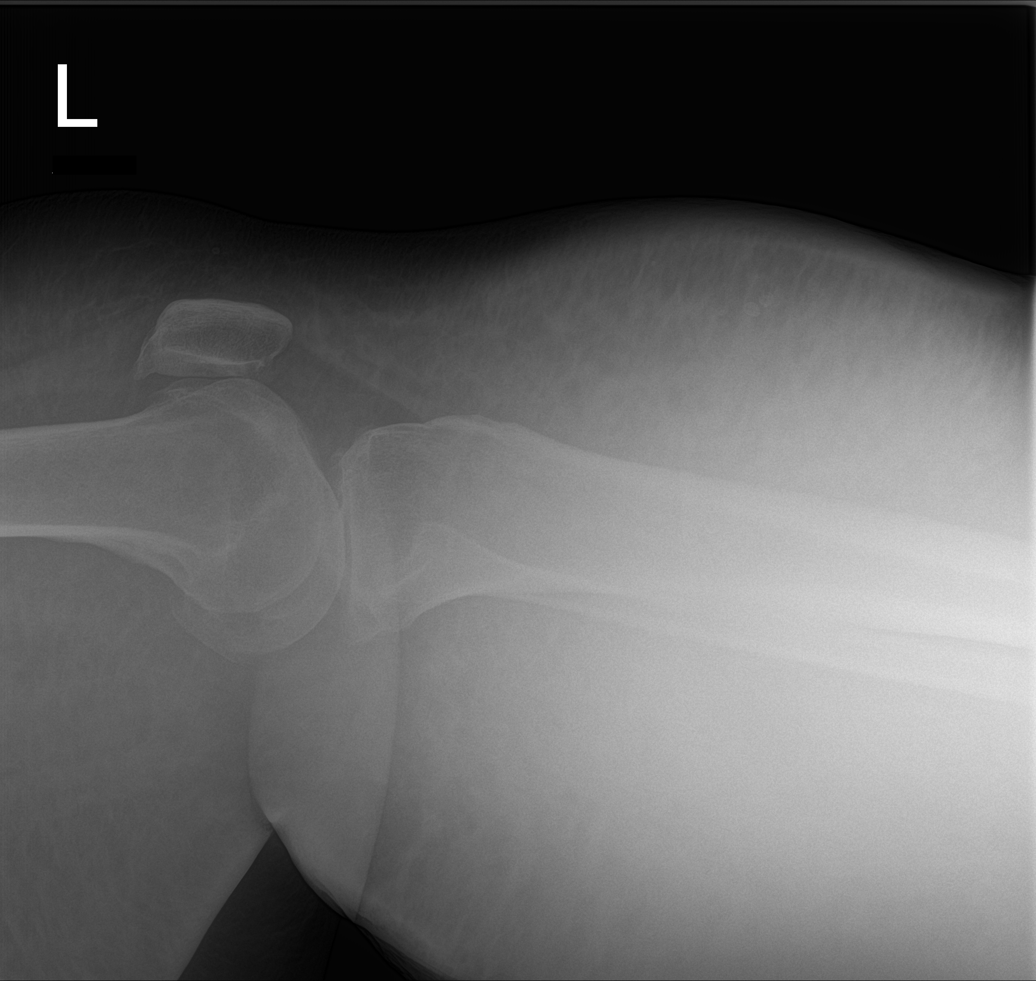

[tibia lat (2 of 2)]
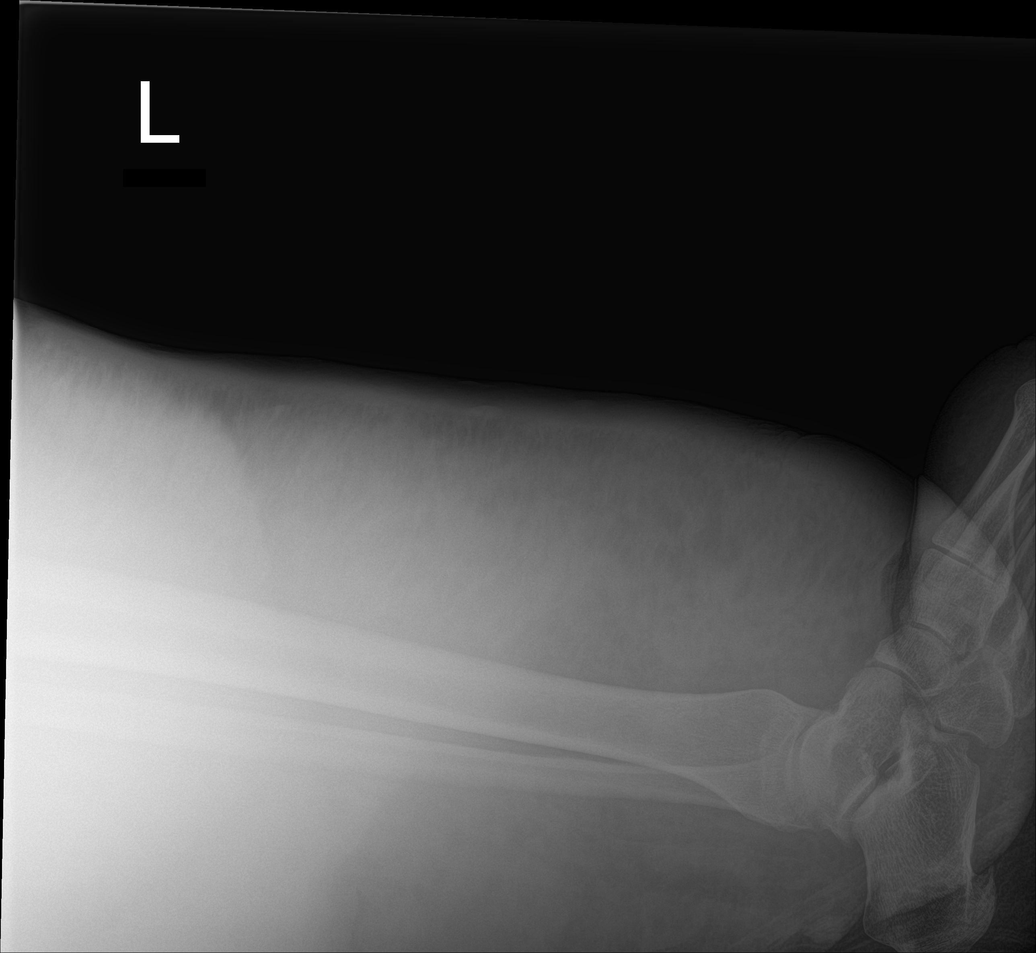

[4 of 4 positions shown; findings below may reference images not displayed]

FINDINGS: There is no evidence of acute fracture or other focal bone lesions.
Marked severity diffuse soft tissue swelling is seen secondary to
the patient's body habitus. A large chronic, lateral soft tissue
defect is seen. This measures approximately 18.7 cm in length.
IMPRESSION: Large, chronic lateral left leg wound, without an acute osseous
abnormality.

## 2021-11-16 ENCOUNTER — Encounter (HOSPITAL_BASED_OUTPATIENT_CLINIC_OR_DEPARTMENT_OTHER): Payer: Medicaid Other | Admitting: General Surgery

## 2022-02-18 ENCOUNTER — Ambulatory Visit: Payer: Medicaid Other | Admitting: Physician Assistant

## 2023-01-31 ENCOUNTER — Emergency Department (HOSPITAL_BASED_OUTPATIENT_CLINIC_OR_DEPARTMENT_OTHER)
Admission: EM | Admit: 2023-01-31 | Discharge: 2023-01-31 | Disposition: A | Discharge status: Home / Self Care | Payer: Medicaid Other | Attending: Emergency Medicine | Admitting: Emergency Medicine

## 2023-01-31 ENCOUNTER — Other Ambulatory Visit: Payer: Self-pay

## 2023-01-31 ENCOUNTER — Encounter (HOSPITAL_BASED_OUTPATIENT_CLINIC_OR_DEPARTMENT_OTHER): Payer: Self-pay | Admitting: Urology

## 2023-01-31 DIAGNOSIS — K0889 Other specified disorders of teeth and supporting structures: Secondary | ICD-10-CM

## 2023-01-31 DIAGNOSIS — E119 Type 2 diabetes mellitus without complications: Secondary | ICD-10-CM | POA: Insufficient documentation

## 2023-01-31 MED ORDER — CLINDAMYCIN HCL 150 MG PO CAPS: 450.0000 mg | ORAL_CAPSULE | Freq: Three times a day (TID) | ORAL | 0 refills | Status: AC

## 2023-01-31 NOTE — Discharge Instructions (Signed)
Please take your antibiotic as directed and follow with the dentist, or another of your choice provided.  You will need to call to make an appointment.

## 2023-01-31 NOTE — ED Triage Notes (Signed)
Pt states upper back tooth fell out yesterday  Now has facial swelling and pain  States fever at home as well  Tylenol at 1830

## 2023-01-31 NOTE — ED Provider Notes (Signed)
Emergency Department Provider Note   I have reviewed the triage vital signs and the nursing notes.   HISTORY  Chief Complaint Dental Pain   HPI [name] [name] is a 32 y.o. female past history reviewed below including diabetes presents to the emergency department with left face swelling.  Patient states she had a left posterior molar which was painful and loose.  She states it was barely hanging on and she removed it with her hand.  It seemed to be intact according to the patient but today noticed pain and swelling in the left side of the face and jaw.  She continues to eat and drink without difficulty.  She states her family felt her today and that she felt very warm and I suspected she had a fever.  She did take Tylenol prior to ED arrival. No rash.    Past Medical History:  Diagnosis Date   Chronic pain of both lower extremities    DM (diabetes mellitus) (HCC)    Lymphedema of both lower extremities    S/P debridement    Venous stasis ulcer (HCC)     Review of Systems  Constitutional: Positive subjective fever/chills Eyes: No visual changes. ENT: Positive dental pain and face swelling.  Cardiovascular: Denies chest pain. Respiratory: Denies shortness of breath. Gastrointestinal: No abdominal pain. Musculoskeletal: Negative for back pain. Skin: Negative for rash. Neurological: Negative for headaches.  ____________________________________________   PHYSICAL EXAM:  VITAL SIGNS: ED Triage Vitals  Encounter Vitals Group     BP 01/31/23 2024 (!) 160/82     Pulse Rate 01/31/23 2024 80     Resp 01/31/23 2024 20     Temp 01/31/23 2024 98.8 F (37.1 C)     Temp src --      SpO2 01/31/23 2024 98 %     Weight 01/31/23 2022 175 lb (79.4 kg)     Height 01/31/23 2022 5\' 7"  (1.702 m)   Constitutional: Alert and oriented. Well appearing and in no acute distress. Eyes: Conjunctivae are normal. Head: Atraumatic. Nose: No congestion/rhinnorhea. Mouth/Throat: Mucous  membranes are moist.  Oropharynx non-erythematous.  Swelling over the left jaw and cheek without fluctuance.  No facial cellulitis.  Poor dentition internally without clear dental abscess.  No trismus.  Soft submandibular compartment.  Neck: No stridor.  Cardiovascular: Normal rate, regular rhythm. Good peripheral circulation. Grossly normal heart sounds.   Respiratory: Normal respiratory effort.  No retractions. Lungs CTAB. Gastrointestinal:  No distention.  Neurologic:  Normal speech and language.  Skin:  Skin is warm, dry and intact. No rash noted.  ____________________________________________   PROCEDURES  Procedure(s) performed:   Procedures  None  ____________________________________________   INITIAL IMPRESSION / ASSESSMENT AND PLAN / ED COURSE  Pertinent labs & imaging results that were available during my care of the patient were reviewed by me and considered in my medical decision making (see chart for details).   This patient is Presenting for Evaluation of dental pain, which does require a range of treatment options, and is a complaint that involves a high risk of morbidity and mortality.  The Differential Diagnoses include dental carries, dental abscess, Ludwig's angina, etc.  Medical Decision Making: Summary:  Patient presents to the emergency department with dental pain and jaw swelling.  She has moderate swelling but no trismus or other hard signs of deeper space dental or neck infection requiring advanced imaging or labs here in the ED.  Vital signs are largely within normal limits other than hypertension.  Pain is fairly well-controlled.  Plan to start clindamycin for broad-spectrum antibiotic coverage.  Gave contact information for the on-call dentist.  Patient plans to call in the morning for ASAP follow up.   Patient's presentation is most consistent with acute, uncomplicated illness.   Disposition:  discharge  ____________________________________________  FINAL CLINICAL IMPRESSION(S) / ED DIAGNOSES  Final diagnoses:  Pain, dental    NEW OUTPATIENT MEDICATIONS STARTED DURING THIS VISIT:  Discharge Medication List as of 01/31/2023  8:47 PM     START taking these medications   Details  clindamycin (CLEOCIN) 150 MG capsule Take 3 capsules (450 mg total) by mouth 3 (three) times daily for 7 days., Starting Tue 01/31/2023, Until Tue 02/07/2023, Normal        Note:  This document was prepared using Dragon voice recognition software and may include unintentional dictation errors.  Alona Bene, MD, [facility]    Cheryllynn Sarff, Arlyss Repress, MD 01/31/23 [phone]

## 2023-09-17 ENCOUNTER — Other Ambulatory Visit: Payer: Self-pay

## 2023-09-17 ENCOUNTER — Encounter (HOSPITAL_BASED_OUTPATIENT_CLINIC_OR_DEPARTMENT_OTHER): Payer: Self-pay | Admitting: Emergency Medicine

## 2023-09-17 ENCOUNTER — Emergency Department (HOSPITAL_BASED_OUTPATIENT_CLINIC_OR_DEPARTMENT_OTHER)
Admission: EM | Admit: 2023-09-17 | Discharge: 2023-09-17 | Discharge status: Against Medical Advice | Attending: Emergency Medicine | Admitting: Emergency Medicine

## 2023-09-17 DIAGNOSIS — L02416 Cutaneous abscess of left lower limb: Secondary | ICD-10-CM | POA: Diagnosis present

## 2023-09-17 DIAGNOSIS — Z89512 Acquired absence of left leg below knee: Secondary | ICD-10-CM | POA: Insufficient documentation

## 2023-09-17 DIAGNOSIS — Z5321 Procedure and treatment not carried out due to patient leaving prior to being seen by health care provider: Secondary | ICD-10-CM | POA: Diagnosis not present

## 2023-09-17 DIAGNOSIS — Z89611 Acquired absence of right leg above knee: Secondary | ICD-10-CM | POA: Diagnosis not present

## 2023-09-17 NOTE — ED Triage Notes (Signed)
 Pt has RT AKA and LT BKA, reports having abscess on the LLE and had it drained, is now having pus and erythema in the area; denies fever or other s/s

## 2023-09-17 NOTE — ED Notes (Signed)
   09/17/23 1657  Respiratory Assessment  $ RT Protocol Assessment  Yes  Assessment Type Assess only  Respiratory Pattern Regular;Unlabored;Symmetrical  Chest Assessment Chest expansion symmetrical  Bilateral Breath Sounds Clear  Oxygen Therapy/Pulse Ox  O2 Therapy Room air  SpO2 97 %   BBS CTA, no stridor noted, upperway clear mallampati score 3-4
